# Patient Record
Sex: Male | Born: 1970 | Race: White | Hispanic: No | Smoking: Former smoker
Health system: Southern US, Community
[De-identification: ages and names within clinical notes are randomized; demographics above are authoritative.]

## PROBLEM LIST (undated history)

## (undated) ENCOUNTER — Emergency Department (HOSPITAL_COMMUNITY): Discharge status: Absent without leave | Payer: 59

## (undated) DIAGNOSIS — Z8669 Personal history of other diseases of the nervous system and sense organs: Secondary | ICD-10-CM

## (undated) DIAGNOSIS — E785 Hyperlipidemia, unspecified: Secondary | ICD-10-CM

## (undated) DIAGNOSIS — R51 Headache: Secondary | ICD-10-CM

## (undated) DIAGNOSIS — R519 Headache, unspecified: Secondary | ICD-10-CM

## (undated) DIAGNOSIS — F1011 Alcohol abuse, in remission: Secondary | ICD-10-CM

## (undated) DIAGNOSIS — K219 Gastro-esophageal reflux disease without esophagitis: Secondary | ICD-10-CM

## (undated) DIAGNOSIS — E669 Obesity, unspecified: Secondary | ICD-10-CM

## (undated) DIAGNOSIS — E119 Type 2 diabetes mellitus without complications: Secondary | ICD-10-CM

## (undated) DIAGNOSIS — I639 Cerebral infarction, unspecified: Secondary | ICD-10-CM

## (undated) DIAGNOSIS — T7840XA Allergy, unspecified, initial encounter: Secondary | ICD-10-CM

## (undated) DIAGNOSIS — I10 Essential (primary) hypertension: Secondary | ICD-10-CM

## (undated) HISTORY — DX: Personal history of other diseases of the nervous system and sense organs: Z86.69

## (undated) HISTORY — DX: Headache: R51

## (undated) HISTORY — DX: Headache, unspecified: R51.9

## (undated) HISTORY — DX: Allergy, unspecified, initial encounter: T78.40XA

## (undated) HISTORY — DX: Obesity, unspecified: E66.9

## (undated) HISTORY — DX: Gastro-esophageal reflux disease without esophagitis: K21.9

## (undated) HISTORY — DX: Hyperlipidemia, unspecified: E78.5

## (undated) HISTORY — DX: Essential (primary) hypertension: I10

## (undated) HISTORY — DX: Alcohol abuse, in remission: F10.11

## (undated) HISTORY — DX: Type 2 diabetes mellitus without complications: E11.9

## (undated) HISTORY — DX: Cerebral infarction, unspecified: I63.9

---
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
## 2003-08-17 ENCOUNTER — Ambulatory Visit (HOSPITAL_COMMUNITY): Admission: RE | Admit: 2003-08-17 | Discharge: 2003-08-17 | Payer: Self-pay | Admitting: Pulmonary Disease

## 2003-09-01 ENCOUNTER — Ambulatory Visit (HOSPITAL_COMMUNITY): Admission: RE | Admit: 2003-09-01 | Discharge: 2003-09-01 | Payer: Self-pay | Admitting: Internal Medicine

## 2012-01-04 ENCOUNTER — Encounter: Payer: Self-pay | Admitting: Family Medicine

## 2012-01-04 ENCOUNTER — Ambulatory Visit (INDEPENDENT_AMBULATORY_CARE_PROVIDER_SITE_OTHER): Payer: 59 | Admitting: Family Medicine

## 2012-01-04 VITALS — BP 158/110 | HR 88 | Temp 98.5°F | Ht 75.0 in | Wt 243.8 lb

## 2012-01-04 DIAGNOSIS — R51 Headache: Secondary | ICD-10-CM

## 2012-01-04 DIAGNOSIS — K219 Gastro-esophageal reflux disease without esophagitis: Secondary | ICD-10-CM | POA: Insufficient documentation

## 2012-01-04 DIAGNOSIS — R519 Headache, unspecified: Secondary | ICD-10-CM | POA: Insufficient documentation

## 2012-01-04 DIAGNOSIS — I10 Essential (primary) hypertension: Secondary | ICD-10-CM

## 2012-01-04 DIAGNOSIS — E669 Obesity, unspecified: Secondary | ICD-10-CM

## 2012-01-04 DIAGNOSIS — E663 Overweight: Secondary | ICD-10-CM | POA: Insufficient documentation

## 2012-01-04 DIAGNOSIS — Z23 Encounter for immunization: Secondary | ICD-10-CM

## 2012-01-04 LAB — CBC WITH DIFFERENTIAL/PLATELET
Basophils Absolute: 0 10*3/uL (ref 0.0–0.1)
Basophils Relative: 0.6 % (ref 0.0–3.0)
Eosinophils Absolute: 0.2 10*3/uL (ref 0.0–0.7)
Eosinophils Relative: 5.1 % — ABNORMAL HIGH (ref 0.0–5.0)
HCT: 45.9 % (ref 39.0–52.0)
Hemoglobin: 15.7 g/dL (ref 13.0–17.0)
Lymphocytes Relative: 31.6 % (ref 12.0–46.0)
Lymphs Abs: 1.4 10*3/uL (ref 0.7–4.0)
MCHC: 34.2 g/dL (ref 30.0–36.0)
MCV: 92.6 fl (ref 78.0–100.0)
Monocytes Absolute: 0.3 10*3/uL (ref 0.1–1.0)
Monocytes Relative: 7 % (ref 3.0–12.0)
Neutro Abs: 2.5 10*3/uL (ref 1.4–7.7)
Neutrophils Relative %: 55.7 % (ref 43.0–77.0)
Platelets: 200 10*3/uL (ref 150.0–400.0)
RBC: 4.96 Mil/uL (ref 4.22–5.81)
RDW: 13.5 % (ref 11.5–14.6)
WBC: 4.5 10*3/uL (ref 4.5–10.5)

## 2012-01-04 LAB — COMPREHENSIVE METABOLIC PANEL
ALT: 32 U/L (ref 0–53)
AST: 24 U/L (ref 0–37)
Albumin: 4.6 g/dL (ref 3.5–5.2)
Alkaline Phosphatase: 58 U/L (ref 39–117)
BUN: 13 mg/dL (ref 6–23)
CO2: 29 mEq/L (ref 19–32)
Calcium: 9.5 mg/dL (ref 8.4–10.5)
Chloride: 106 mEq/L (ref 96–112)
Creatinine, Ser: 1.1 mg/dL (ref 0.4–1.5)
GFR: 81.01 mL/min (ref >60.00)
Glucose, Bld: 100 mg/dL — ABNORMAL HIGH (ref 70–99)
Potassium: 4.6 mEq/L (ref 3.5–5.1)
Sodium: 141 mEq/L (ref 135–145)
Total Bilirubin: 0.3 mg/dL (ref 0.3–1.2)
Total Protein: 7.4 g/dL (ref 6.0–8.3)

## 2012-01-04 LAB — LIPID PANEL
Cholesterol: 185 mg/dL (ref 0–200)
HDL: 45.4 mg/dL (ref >39.00)
LDL Cholesterol: 118 mg/dL — ABNORMAL HIGH (ref 0–99)
Total CHOL/HDL Ratio: 4
Triglycerides: 107 mg/dL (ref 0.0–149.0)
VLDL: 21.4 mg/dL (ref 0.0–40.0)

## 2012-01-04 LAB — TSH: TSH: 1.24 u[IU]/mL (ref 0.35–5.50)

## 2012-01-04 LAB — MICROALBUMIN / CREATININE URINE RATIO
Creatinine,U: 153.2 mg/dL
Microalb Creat Ratio: 1 mg/g (ref 0.0–30.0)
Microalb, Ur: 1.6 mg/dL (ref 0.0–1.9)

## 2012-01-04 MED ORDER — HYDROCHLOROTHIAZIDE 25 MG PO TABS: 25.0000 mg | ORAL_TABLET | Freq: Every day | ORAL | Status: DC

## 2012-01-04 MED ORDER — CYCLOBENZAPRINE HCL 10 MG PO TABS: 10.0000 mg | ORAL_TABLET | Freq: Two times a day (BID) | ORAL | Status: AC | PRN

## 2012-01-04 NOTE — Assessment & Plan Note (Signed)
Significant weight loss recently.  Encouraged, rec continue healthy diet changes.

## 2012-01-04 NOTE — Progress Notes (Signed)
Addended by: Josph Macho A on: 01/04/2012 11:10 AM   Modules accepted: Orders

## 2012-01-04 NOTE — Assessment & Plan Note (Addendum)
H/o this years past. Discussed low salt, increased potassium in diet, benefits of weight loss. Blood work today. Start HCTZ - see pt instructions. EKG today for baseline - NSR, rate 66, normal axis, intervals, no ST/T changes rtc 4-6 wks for f/u.

## 2012-01-04 NOTE — Assessment & Plan Note (Signed)
Anticipate TTH given story. Trial muscle relaxant. Avoid NSAIDs, BC powders for now. Re assess next month.

## 2012-01-04 NOTE — Assessment & Plan Note (Signed)
Controlled on zegerid daily.

## 2012-01-04 NOTE — Progress Notes (Signed)
Subjective:    Patient ID: Jeffery Banks, male    DOB: [birth year], 41 y.o.   MRN: [id number]  HPI CC: new pt, HTN  HTN - h/o HTN 12 yrs ago on atenolol, got under control and stopped med.  + HA - see below.  No CP/tightness, SOB, leg swelling.  HA - daily, going on for several years now.  Staying same, not progressively worsening.  start in neck, travel up back of head.  Described as throbbing pain, steady constant as well.  No n/v, photo/phonophobia.  No vision changes.  + h/o migraines as teenager.  Body mass index is 30.47 kg/(m^2). Wt Readings from Last 3 Encounters:  01/04/12 243 lb 12 oz (110.564 kg)  Recent weight loss - previously 275.  Has been changing diet (cut out sodas and late eating), increasing activity.  Preventative: No recent CPE (10 yrs ago).  Last blood work was also 10 yrs ago. No flu shot. Tetanus shot - unsure.  Tdap today.  Caffeine: coffee in am Lives with wife and 3 children Occupation: Transport planner for [employer] Edu: Associate's degree Activity: walking at night Diet: water daily, some fruits/vegetables, red meat 2x/wk, fish 1x/mo  Medications and allergies reviewed and updated in chart.  Past histories reviewed and updated if relevant as below. There is no problem list on file for this patient.  Past Medical History  Diagnosis Date  . Hx of migraines     as child  . Generalized headaches     daily  . HTN (hypertension)   . GERD (gastroesophageal reflux disease)   . Alcohol abuse, in remission college   Past Surgical History  Procedure Date  . Appendectomy 1982   History  Substance Use Topics  . Smoking status: Former Smoker    Quit date: 10/22/2010  . Smokeless tobacco: Never Used  . Alcohol Use: No   Family History  Problem Relation Age of Onset  . Hypertension Mother   . Cancer Maternal Aunt     beast  . Hypertension Maternal Grandfather   . Coronary artery disease Maternal Grandfather   . Hypertension Paternal Grandfather     . Coronary artery disease Paternal Grandfather   . Stroke Neg Hx   . Diabetes Neg Hx    No Known Allergies No current outpatient prescriptions on file prior to visit.     Review of Systems  Constitutional: Negative for fever, chills, activity change, appetite change, fatigue and unexpected weight change.  HENT: Negative for hearing loss and neck pain.   Eyes: Negative for visual disturbance.  Respiratory: Negative for cough, chest tightness, shortness of breath and wheezing.   Cardiovascular: Negative for chest pain, palpitations and leg swelling.  Gastrointestinal: Negative for nausea, vomiting, abdominal pain, diarrhea, constipation, blood in stool and abdominal distention.  Genitourinary: Negative for hematuria and difficulty urinating.  Musculoskeletal: Negative for myalgias and arthralgias.  Skin: Negative for rash.  Neurological: Positive for headaches. Negative for dizziness, seizures and syncope.  Hematological: Does not bruise/bleed easily.  Psychiatric/Behavioral: Negative for dysphoric mood. The patient is not nervous/anxious.        Objective:   Physical Exam  Nursing note and vitals reviewed. Constitutional: Jeffery Banks is oriented to person, place, and time. Jeffery Banks appears well-developed and well-nourished. No distress.  HENT:  Head: Normocephalic and atraumatic.  Right Ear: External ear normal.  Left Ear: External ear normal.  Nose: Nose normal.  Mouth/Throat: Oropharynx is clear and moist. No oropharyngeal exudate.  Eyes: Conjunctivae and EOM are  normal. Pupils are equal, round, and reactive to light. No scleral icterus.  Neck: Normal range of motion. Neck supple. Carotid bruit is not present. No thyromegaly present.  Cardiovascular: Normal rate, regular rhythm, normal heart sounds and intact distal pulses.   No murmur heard. Pulses:      Radial pulses are 2+ on the right side, and 2+ on the left side.  Pulmonary/Chest: Effort normal and breath sounds normal. No  respiratory distress. Jeffery Banks has no wheezes. Jeffery Banks has no rales.  Abdominal: Soft. Bowel sounds are normal. Jeffery Banks exhibits no distension and no mass. There is no tenderness. There is no rebound and no guarding.       No abd/renal bruits  Musculoskeletal: Normal range of motion. Jeffery Banks exhibits no edema.       FROM neck. No midline spine tenderness. No trap muscle tightness/spasm appreciated today  Lymphadenopathy:    Jeffery Banks has no cervical adenopathy.  Neurological: Jeffery Banks is alert and oriented to person, place, and time.       CN grossly intact, station and gait intact  Skin: Skin is warm and dry. No rash noted.  Psychiatric: Jeffery Banks has a normal mood and affect. His behavior is normal. Judgment and thought content normal.      Assessment & Plan:

## 2012-01-04 NOTE — Patient Instructions (Addendum)
Blood work today. EKG today. Start hydrochlorothiazide at 12.5mg  (1/2 pill) daily for 1 week then may increase to 25mg  (1 pill daily). Return in 3-4 weeks to recheck blood work Engineer, structural)  Return to see me in 4-6 weeks to see how wer'e doing. Tdap today. For headaches - sound like tension type headaches or could be related to blood pressure.  Try flexeril 10mg  as needed.

## 2012-01-04 NOTE — Progress Notes (Signed)
Addended by: Eustaquio Boyden on: 01/04/2012 10:55 AM   Modules accepted: Orders

## 2012-02-07 ENCOUNTER — Encounter: Payer: Self-pay | Admitting: Family Medicine

## 2012-02-07 ENCOUNTER — Ambulatory Visit (INDEPENDENT_AMBULATORY_CARE_PROVIDER_SITE_OTHER): Payer: 59 | Admitting: Family Medicine

## 2012-02-07 VITALS — BP 150/90 | HR 80 | Temp 97.4°F | Wt 245.2 lb

## 2012-02-07 DIAGNOSIS — R519 Headache, unspecified: Secondary | ICD-10-CM

## 2012-02-07 DIAGNOSIS — R51 Headache: Secondary | ICD-10-CM

## 2012-02-07 DIAGNOSIS — I10 Essential (primary) hypertension: Secondary | ICD-10-CM

## 2012-02-07 MED ORDER — LISINOPRIL 10 MG PO TABS: 10.0000 mg | ORAL_TABLET | Freq: Every day | ORAL | Status: DC

## 2012-02-07 NOTE — Assessment & Plan Note (Signed)
rec try flexeril at 5mg  daily (1/2 pill)

## 2012-02-07 NOTE — Patient Instructions (Signed)
Continue hydrochlorothiazide. Start lisinopril at 10mg  daily. Return in 2 weeks for blood work and in 1 1/2 -2 months for follow up. Good to see you today, call us with questions.

## 2012-02-07 NOTE — Progress Notes (Signed)
  Subjective:    Patient ID: Jeffery Banks, male    DOB: Mar 12, 1971, 41 y.o.   MRN: [id number]  HPI CC: f/u HTN  HTN - Seen here last month as new patient with h/o HTN.  Started on HCTZ 25mg  daily.  Tolerating med well but notes bp still elevated at home.  No HA, vision changes, CP/tightness, SOB, leg swelling.  Good amt water during day.  BP Readings from Last 3 Encounters:  02/07/12 150/90  01/04/12 158/110    HA - thought TTH last visit.  Flexeril makes him too sleepy.  Also notices sinus congestion with headaches, wonders if sinus headache. Eventually wears off during day.  If lingers takes aleve which resolves HA.  Has stopped bc and goody powders.  Review of Systems Per HPI    Objective:   Physical Exam  Nursing note and vitals reviewed. Constitutional: He appears well-developed and well-nourished. No distress.  HENT:  Head: Normocephalic and atraumatic.  Mouth/Throat: Oropharynx is clear and moist. No oropharyngeal exudate.  Eyes: Conjunctivae and EOM are normal. Pupils are equal, round, and reactive to light. No scleral icterus.  Neck: Normal range of motion. Neck supple. Carotid bruit is not present.  Cardiovascular: Normal rate, regular rhythm, normal heart sounds and intact distal pulses.   No murmur heard. Pulmonary/Chest: Breath sounds normal. No respiratory distress. He has no wheezes. He has no rales.  Skin: Skin is warm and dry. No rash noted.       Assessment & Plan:

## 2012-02-07 NOTE — Assessment & Plan Note (Signed)
Chronic. staying elevated although some better control. Will add on lisinopril. Advised to watch for angioedema sxs and dry cough, and watch for dizziness. rtc 2wks for bmp, and 1-2 mo for OV.

## 2012-02-21 ENCOUNTER — Other Ambulatory Visit: Payer: Self-pay | Admitting: *Deleted

## 2012-02-21 ENCOUNTER — Other Ambulatory Visit (INDEPENDENT_AMBULATORY_CARE_PROVIDER_SITE_OTHER): Payer: 59

## 2012-02-21 DIAGNOSIS — I10 Essential (primary) hypertension: Secondary | ICD-10-CM

## 2012-02-21 LAB — BASIC METABOLIC PANEL
BUN: 19 mg/dL (ref 6–23)
CO2: 27 mEq/L (ref 19–32)
Calcium: 9.7 mg/dL (ref 8.4–10.5)
Chloride: 104 mEq/L (ref 96–112)
Creatinine, Ser: 1 mg/dL (ref 0.4–1.5)
GFR: 87.46 mL/min (ref >60.00)
Glucose, Bld: 105 mg/dL — ABNORMAL HIGH (ref 70–99)
Potassium: 3.8 mEq/L (ref 3.5–5.1)
Sodium: 139 mEq/L (ref 135–145)

## 2012-02-21 MED ORDER — CYCLOBENZAPRINE HCL 10 MG PO TABS: 10.0000 mg | ORAL_TABLET | Freq: Three times a day (TID) | ORAL | Status: DC | PRN

## 2012-02-21 NOTE — Telephone Encounter (Signed)
Sent in.plz notify pt.  

## 2012-02-21 NOTE — Telephone Encounter (Signed)
Ok to refill so he can have it on hand for headaches?

## 2012-04-15 ENCOUNTER — Ambulatory Visit (INDEPENDENT_AMBULATORY_CARE_PROVIDER_SITE_OTHER): Payer: 59 | Admitting: Family Medicine

## 2012-04-15 ENCOUNTER — Encounter: Payer: Self-pay | Admitting: Family Medicine

## 2012-04-15 VITALS — BP 136/82 | HR 96 | Temp 98.0°F | Wt 246.0 lb

## 2012-04-15 DIAGNOSIS — I10 Essential (primary) hypertension: Secondary | ICD-10-CM

## 2012-04-15 DIAGNOSIS — R51 Headache: Secondary | ICD-10-CM

## 2012-04-15 NOTE — Assessment & Plan Note (Signed)
Improved with better blood pressure control.

## 2012-04-15 NOTE — Assessment & Plan Note (Signed)
Chronic, stable. Great control as evidenced by #s at home.  Continue meds, rtc 6 mo for physical.

## 2012-04-15 NOTE — Patient Instructions (Signed)
Great to see you today, call us with questions. Let me know if leg rash not improving. Continue meds as up to now.

## 2012-04-15 NOTE — Progress Notes (Signed)
  Subjective:    Patient ID: Jeffery Banks, male    DOB: 1970/11/11, 41 y.o.   MRN: [id number]  HPI CC: f/u HTN  2 mo f/u HTN - tolerating meds well.  No HA, vision changes, CP/tightness, SOB, leg swelling.  At home running 120/80s.    HA - doing well.  hsan't had any recently.  Thinks better bp control has helped.  Was recently on vacation.  Used sunscreen but does burn easily.  Burned right lower leg, rash has developed.  Treating with aloe cream and compresses.  Review of Systems Per HPI    Objective:   Physical Exam  Vitals reviewed. Constitutional: He appears well-developed and well-nourished. No distress.  HENT:  Head: Normocephalic and atraumatic.  Mouth/Throat: Oropharynx is clear and moist. No oropharyngeal exudate.  Eyes: Conjunctivae and EOM are normal. Pupils are equal, round, and reactive to light. No scleral icterus.  Cardiovascular: Normal rate, regular rhythm, normal heart sounds and intact distal pulses.   No murmur heard. Pulmonary/Chest: Effort normal and breath sounds normal. No respiratory distress. He has no wheezes. He has no rales.  Abdominal: Soft. There is no tenderness.       No abd/renal bruits  Musculoskeletal: He exhibits no edema.  Skin: Skin is warm and dry. No rash noted. There is erythema.       Right lower leg anteriorly with blistered and erythematous rash, tender  Psychiatric: He has a normal mood and affect.       Assessment & Plan:

## 2012-10-16 ENCOUNTER — Ambulatory Visit (INDEPENDENT_AMBULATORY_CARE_PROVIDER_SITE_OTHER): Payer: 59 | Admitting: Family Medicine

## 2012-10-16 ENCOUNTER — Ambulatory Visit: Payer: 59

## 2012-10-16 VITALS — BP 139/92 | HR 86 | Temp 98.3°F | Resp 20 | Ht 75.0 in | Wt 249.0 lb

## 2012-10-16 DIAGNOSIS — R05 Cough: Secondary | ICD-10-CM

## 2012-10-16 DIAGNOSIS — R0609 Other forms of dyspnea: Secondary | ICD-10-CM

## 2012-10-16 DIAGNOSIS — R0600 Dyspnea, unspecified: Secondary | ICD-10-CM

## 2012-10-16 DIAGNOSIS — J40 Bronchitis, not specified as acute or chronic: Secondary | ICD-10-CM

## 2012-10-16 DIAGNOSIS — R059 Cough, unspecified: Secondary | ICD-10-CM

## 2012-10-16 DIAGNOSIS — R0989 Other specified symptoms and signs involving the circulatory and respiratory systems: Secondary | ICD-10-CM

## 2012-10-16 DIAGNOSIS — R062 Wheezing: Secondary | ICD-10-CM

## 2012-10-16 LAB — POCT CBC
Granulocyte percent: 65.4 %G (ref 37–80)
HCT, POC: 48.6 % (ref 43.5–53.7)
Hemoglobin: 15.5 g/dL (ref 14.1–18.1)
Lymph, poc: 1.2 (ref 0.6–3.4)
MCH, POC: 31.1 pg (ref 27–31.2)
MCHC: 31.9 g/dL (ref 31.8–35.4)
MCV: 97.6 fL — AB (ref 80–97)
MID (cbc): 0.4 (ref 0–0.9)
MPV: 9 fL (ref 0–99.8)
POC Granulocyte: 3 (ref 2–6.9)
POC LYMPH PERCENT: 26 %L (ref 10–50)
POC MID %: 8.6 %M (ref 0–12)
Platelet Count, POC: 203 10*3/uL (ref 142–424)
RBC: 4.98 M/uL (ref 4.69–6.13)
RDW, POC: 13.1 %
WBC: 4.6 10*3/uL (ref 4.6–10.2)

## 2012-10-16 LAB — POCT INFLUENZA A/B
Influenza A, POC: NEGATIVE
Influenza B, POC: NEGATIVE

## 2012-10-16 MED ORDER — HYDROCODONE-HOMATROPINE 5-1.5 MG/5ML PO SYRP: 5.0000 mL | ORAL_SOLUTION | Freq: Three times a day (TID) | ORAL | Status: DC | PRN

## 2012-10-16 MED ORDER — ALBUTEROL SULFATE (2.5 MG/3ML) 0.083% IN NEBU
2.5000 mg | INHALATION_SOLUTION | Freq: Once | RESPIRATORY_TRACT | Status: AC
  Administered 2012-10-16: 2.5 mg via RESPIRATORY_TRACT

## 2012-10-16 MED ORDER — PREDNISONE 20 MG PO TABS: ORAL_TABLET | ORAL | Status: DC

## 2012-10-16 MED ORDER — ALBUTEROL SULFATE HFA 108 (90 BASE) MCG/ACT IN AERS: 2.0000 | INHALATION_SPRAY | Freq: Four times a day (QID) | RESPIRATORY_TRACT | Status: DC | PRN

## 2012-10-16 NOTE — Patient Instructions (Addendum)
Bronchitis Bronchitis is the body's way of reacting to injury and/or infection (inflammation) of the bronchi. Bronchi are the air tubes that extend from the windpipe into the lungs. If the inflammation becomes severe, it may cause shortness of breath. CAUSES  Inflammation may be caused by:  A virus.  Germs (bacteria).  Dust.  Allergens.  Pollutants and many other irritants. The cells lining the bronchial tree are covered with tiny hairs (cilia). These constantly beat upward, away from the lungs, toward the mouth. This keeps the lungs free of pollutants. When these cells become too irritated and are unable to do their job, mucus begins to develop. This causes the characteristic cough of bronchitis. The cough clears the lungs when the cilia are unable to do their job. Without either of these protective mechanisms, the mucus would settle in the lungs. Then you would develop pneumonia. Smoking is a common cause of bronchitis and can contribute to pneumonia. Stopping this habit is the single most important thing you can do to help yourself. TREATMENT   Your caregiver may prescribe an antibiotic if the cough is caused by bacteria. Also, medicines that open up your airways make it easier to breathe. Your caregiver may also recommend or prescribe an expectorant. It will loosen the mucus to be coughed up. Only take over-the-counter or prescription medicines for pain, discomfort, or fever as directed by your caregiver.  Removing whatever causes the problem (smoking, for example) is critical to preventing the problem from getting worse.  Cough suppressants may be prescribed for relief of cough symptoms.  Inhaled medicines may be prescribed to help with symptoms now and to help prevent problems from returning.  For those with recurrent (chronic) bronchitis, there may be a need for steroid medicines. SEEK IMMEDIATE MEDICAL CARE IF:   During treatment, you develop more pus-like mucus (purulent  sputum).  You have a fever.  Your baby is older than 3 months with a rectal temperature of 102 F (38.9 C) or higher.  Your baby is 22 months old or younger with a rectal temperature of 100.4 F (38 C) or higher.  You become progressively more ill.  You have increased difficulty breathing, wheezing, or shortness of breath. It is necessary to seek immediate medical care if you are elderly or sick from any other disease. MAKE SURE YOU:   Understand these instructions.  Will watch your condition.  Will get help right away if you are not doing well or get worse. Document Released: 10/08/2005 Document Revised: 12/31/2011 Document Reviewed: 08/17/2008 [facility] Patient Information 2013 [place], [place]. Using Your Inhaler Proper inhaler technique is very important. Good technique assures that the medicine reaches the lungs. Poor technique results in depositing the medicine on the tongue and back of the throat rather than in the airways. STEPS TO FOLLOW IF USING INHALER WITHOUT EXTENSION TUBE: 1. Remove cap from inhaler. 2. Shake inhaler for 5 seconds before each inhalation (breathing in). 3. Position the inhaler so that the top of the canister faces up. 4. Put your index finger on the top of the medication canister. Your thumb supports the bottom of the inhaler. 5. Open your mouth. 6. Hold the inhaler 1 to 2 inches away from your open mouth. This allows the medicine to slow down before the medicine enters the mouth. 7. Exhale (breathe out) normally and as completely as possible. 8. Press the canister down with the index finger to release the medication. 9. At the same time as the canister is pressed, inhale deeply  and slowly until the lungs are completely filled. This should take 4 to 6 seconds. Keep your tongue down and out of the way. 10. Hold the medication in your lungs for up to 10 seconds (10 seconds is best). This helps the medicine get into the small airways of your lungs to work  better. 11. Breathe out slowly, through pursed lips. Whistling is an example of pursed lips. 12. Wait at least 1 minute between puffs. Continue with the above steps until you have taken the number of puffs your caregiver has ordered. 13. Replace cap on inhaler. STEPS TO FOLLOW USING AN INHALER WITH AN EXTENSION (SPACER): 1.  Remove cap from inhaler. 2. Shake inhaler for 5 seconds before each inhalation (breathing in). 3. Your caregiver has asked you to use a spacer with your inhaler. A spacer is a plastic tube with a mouthpiece on one end and an opening that connects to the inhaler on the other end. A spacer helps you take the medicine better. 4. Place the open end of the spacer onto the mouthpiece of the inhaler. 5. Position the inhaler so that the top of the canister faces up and the spacer mouthpiece faces you. 6. Put your index finger on the top of the medication canister. Your thumb supports the bottom of the inhaler and the spacer. 7. Exhale (breathe out) normally and as completely as possible. 8. Immediately after exhaling, place the spacer between your teeth and into your mouth. Close your mouth tightly around the spacer. 9. Press the canister down with the index finger to release the medication. 10. At the same time as the canister is pressed, inhale deeply and slowly until the lungs are completely filled. This should take 4 to 6 seconds. Keep your tongue down and out of the way. 11. Hold the medication in your lungs for up to 10 seconds (10 seconds is best). This helps the medicine get into the small airways of your lungs to work better. Exhale. 12. Repeat inhaling deeply through the spacer mouthpiece. Again hold that breath for up to 10 seconds (10 seconds is best). Exhale slowly. If it is difficult to take this second deep breath through the spacer, breathe normally several times through the spacer. Remove the spacer from your mouth. 13. Wait at least 1 minute between puffs. Continue  with the above steps until you have taken the number of puffs your caregiver has ordered. 14. Remove spacer from the inhaler and place cap on inhaler. If you are using different kinds of inhalers, use your quick relief medicine to open the airways 10 - 15 minutes before using a steroid. If you are unsure which inhalers to use and the order of using them, ask your caregiver, nurse, or respiratory therapist. If you are using a steroid inhaler, rinse your mouth with water after your last puff and then spit out the water. Do not swallow the water. Avoid the following:  Inhaling before or after starting the spray of medicine. It takes practice to coordinate your breathing with triggering the spray.  Inhaling through the nose (rather than the mouth) when triggering the spray. HOW TO DETERMINE IF YOUR INHALER IS FULL OR NEARLY EMPTY:  Determine when an inhaler is empty. You cannot know when an inhaler is empty by shaking it. A few inhalers are now being made with dose counters. Ask your caregiver for a prescription that has a dose counter if you feel you need that extra help.  If your inhaler does not  have a counter, check the number of doses in the inhaler before you use it. The canister or box will list the number of doses in the canister. Divide the total number of doses in the canister by the number you will use each day to find how many days the canister will last. (For example, if your canister has 200 doses and you take 2 puffs, 4 times each day, which is 8 puffs a day. Dividing 200 by 8 equals 25. The canister should last 25 days.) Using a calendar, count forward that many days to see when your inhaler will run out. Write the refill date on a calendar or your canister.  Remember, if you need to take extra doses, the inhaler will empty sooner than you figured. Be sure you have a refill before your canister runs out. Refill your inhaler 7 to 10 days before it runs out. HOME CARE INSTRUCTIONS   Do  not use the inhaler more than your caregiver tells you. If you are still wheezing and are feeling tightness in your chest, call your caregiver.  Keep an adequate supply of medication. This includes making sure the medicine is not expired, and you have a spare inhaler.  Follow your caregiver or inhaler insert directions for cleaning the inhaler and spacer. SEEK MEDICAL CARE IF:   Symptoms are only partially relieved with your inhaler.  You are having trouble using your inhaler.  You experience some increase in phlegm.  You develop a fever of 100.5 F (38.1 C). SEEK IMMEDIATE MEDICAL CARE IF:   You feel little or no relief with your inhalers. You are still wheezing and are feeling shortness of breath and/or tightness in your chest.  You have side effects such as dizziness, headaches or fast heart rate.  You have chills, fever, night sweats or an oral temperature above 102 F (38.9 C).  Phlegm production increases a lot, or there is blood in the phlegm. MAKE SURE YOU:   Understand these instructions.  Will watch your condition.  Will get help right away if you are not doing well or get worse. Document Released: 10/05/2000 Document Revised: 12/31/2011 Document Reviewed: 07/26/2009 [facility] Patient Information 2013 [place], [place].

## 2012-10-16 NOTE — Progress Notes (Signed)
41 yo man with acute respiratory distress and fever since he fell ill on Monday.  He started with cough, aches, fatigue, fever, chills and rigors..  The cough is worse at night.  Today he is bringing up phlegm.  Did not get a flu shot. Works for [employer] PMHx:  htn and reflux Sig Negs:  No h/o smoking, asthma or pulmonary disease  Objective:  NAD HEENT:  Unremarkable Neck:  Supple without adenop Heart:  Reg without murmur Chest:  Diffuse wheezes on expiration and bibasilar rales Ext:  No edema, calves nontender  [facility] reading (PRIMARY) by  Dr. Milus Glazier CXR  No infiltrate or cardiomegaly Results for orders placed in visit on 10/16/12  POCT CBC      Component Value Range   WBC 4.6  4.6 - 10.2 K/uL   Lymph, poc 1.2  0.6 - 3.4   POC LYMPH PERCENT 26.0  10 - 50 %L   MID (cbc) 0.4  0 - 0.9   POC MID % 8.6  0 - 12 %M   POC Granulocyte 3.0  2 - 6.9   Granulocyte percent 65.4  37 - 80 %G   RBC 4.98  4.69 - 6.13 M/uL   Hemoglobin 15.5  14.1 - 18.1 g/dL   HCT, POC 47.8  29.5 - 53.7 %   MCV 97.6 (*) 80 - 97 fL   MCH, POC 31.1  27 - 31.2 pg   MCHC 31.9  31.8 - 35.4 g/dL   RDW, POC 62.1     Platelet Count, POC 203  142 - 424 K/uL   MPV 9.0  0 - 99.8 fL  POCT INFLUENZA A/B      Component Value Range   Influenza A, POC Negative     Influenza B, POC Negative        Assessment:  .viral bronchitis  Plan:

## 2012-10-23 ENCOUNTER — Encounter: Payer: 59 | Admitting: Family Medicine

## 2012-10-23 ENCOUNTER — Other Ambulatory Visit: Payer: 59

## 2012-12-22 ENCOUNTER — Other Ambulatory Visit: Payer: Self-pay | Admitting: Family Medicine

## 2013-03-18 ENCOUNTER — Other Ambulatory Visit: Payer: Self-pay | Admitting: Family Medicine

## 2013-07-04 ENCOUNTER — Other Ambulatory Visit: Payer: Self-pay | Admitting: Family Medicine

## 2013-10-23 ENCOUNTER — Other Ambulatory Visit: Payer: Self-pay | Admitting: Family Medicine

## 2013-10-30 ENCOUNTER — Encounter: Payer: Self-pay | Admitting: Family Medicine

## 2013-10-30 ENCOUNTER — Ambulatory Visit (INDEPENDENT_AMBULATORY_CARE_PROVIDER_SITE_OTHER): Payer: 59 | Admitting: Family Medicine

## 2013-10-30 VITALS — BP 138/100 | HR 64 | Temp 98.2°F | Wt 254.5 lb

## 2013-10-30 DIAGNOSIS — I10 Essential (primary) hypertension: Secondary | ICD-10-CM

## 2013-10-30 DIAGNOSIS — Z23 Encounter for immunization: Secondary | ICD-10-CM

## 2013-10-30 LAB — MICROALBUMIN / CREATININE URINE RATIO
Creatinine,U: 38.1 mg/dL
Microalb Creat Ratio: 0.8 mg/g (ref 0.0–30.0)
Microalb, Ur: 0.3 mg/dL (ref 0.0–1.9)

## 2013-10-30 LAB — BASIC METABOLIC PANEL
BUN: 15 mg/dL (ref 6–23)
CO2: 28 mEq/L (ref 19–32)
Calcium: 9.6 mg/dL (ref 8.4–10.5)
Chloride: 105 mEq/L (ref 96–112)
Creatinine, Ser: 1.2 mg/dL (ref 0.4–1.5)
GFR: 74.65 mL/min (ref >60.00)
Glucose, Bld: 88 mg/dL (ref 70–99)
Potassium: 4.2 mEq/L (ref 3.5–5.1)
Sodium: 139 mEq/L (ref 135–145)

## 2013-10-30 MED ORDER — LISINOPRIL 10 MG PO TABS: ORAL_TABLET | ORAL | Status: DC

## 2013-10-30 MED ORDER — HYDROCHLOROTHIAZIDE 25 MG PO TABS: ORAL_TABLET | ORAL | Status: DC

## 2013-10-30 NOTE — Progress Notes (Signed)
Pre-visit discussion using our clinic review tool. No additional management support is needed unless otherwise documented below in the visit note.  

## 2013-10-30 NOTE — Progress Notes (Signed)
   Subjective:    Patient ID: Jeffery GuarneriJason Vincent Banks, male    DOB: April 04, 1971, 43 y.o.   MRN: [id number]  HPI CC: med refill  HTN - Compliant with current antihypertensive regimen of lisinopril 10mg  and hctz 25mg  daily (out of med for last 2 weeks).  Does check blood pressures at home: 115-122/80-90.  No low blood pressure readings or symptoms of dizziness/syncope.  Denies HA, vision changes, CP/tightness, SOB, leg swelling.   BP Readings from Last 3 Encounters:  10/30/13 138/100  10/16/12 139/92  04/15/12 136/82   Body mass index is 31.81 kg/(m^2).  Wt Readings from Last 3 Encounters:  10/30/13 254 lb 8 oz (115.44 kg)  10/16/12 249 lb (112.946 kg)  04/15/12 246 lb (111.585 kg)    Past Medical History  Diagnosis Date  . Hx of migraines     as child  . Generalized headaches     daily  . HTN (hypertension)   . GERD (gastroesophageal reflux disease)   . Alcohol abuse, in remission college  . Obesity     borderline     Review of Systems Per HPi    Objective:   Physical Exam  Nursing note and vitals reviewed. Constitutional: He appears well-developed and well-nourished. No distress.  HENT:  Mouth/Throat: Oropharynx is clear and moist. No oropharyngeal exudate.  Eyes: Conjunctivae and EOM are normal. Pupils are equal, round, and reactive to light. No scleral icterus.  Cardiovascular: Normal rate, regular rhythm, normal heart sounds and intact distal pulses.   No murmur heard. Pulmonary/Chest: Effort normal and breath sounds normal. No respiratory distress. He has no wheezes. He has no rales.  Musculoskeletal: He exhibits no edema.  Lymphadenopathy:    He has no cervical adenopathy.  Skin: Skin is warm and dry. No rash noted.       Assessment & Plan:

## 2013-10-30 NOTE — Patient Instructions (Signed)
Flu shot today. Blood work today. Return as needed for follow up or in 1 year for physical. Good to see you today, call us with questions.

## 2013-10-30 NOTE — Assessment & Plan Note (Signed)
Chronic, stable. Continue meds  Refilled x 1 year today. Check Cr and K and microalb today.

## 2013-11-02 ENCOUNTER — Encounter: Payer: Self-pay | Admitting: *Deleted

## 2013-11-02 NOTE — Addendum Note (Signed)
Addended by: Annamarie MajorFUQUAY, Venisha Boehning S on: 11/02/2013 10:38 AM   Modules accepted: Orders

## 2014-02-05 ENCOUNTER — Other Ambulatory Visit: Payer: Self-pay | Admitting: Family Medicine

## 2014-02-05 NOTE — Telephone Encounter (Signed)
Ok to refill 

## 2014-02-06 NOTE — Telephone Encounter (Signed)
Sent!

## 2014-07-26 ENCOUNTER — Telehealth: Payer: Self-pay | Admitting: *Deleted

## 2014-07-26 NOTE — Telephone Encounter (Signed)
Request from pharmacy for chantix. Dr. Reece AgarG has never prescribed for patient. LMOM for patient to return my call.

## 2014-09-21 ENCOUNTER — Other Ambulatory Visit: Payer: Self-pay | Admitting: Family Medicine

## 2014-09-21 NOTE — Telephone Encounter (Signed)
Ok to refill 

## 2014-11-09 ENCOUNTER — Other Ambulatory Visit: Payer: Self-pay | Admitting: Family Medicine

## 2014-12-13 ENCOUNTER — Other Ambulatory Visit: Payer: Self-pay | Admitting: Family Medicine

## 2014-12-16 ENCOUNTER — Other Ambulatory Visit: Payer: Self-pay | Admitting: Family Medicine

## 2014-12-28 ENCOUNTER — Ambulatory Visit (INDEPENDENT_AMBULATORY_CARE_PROVIDER_SITE_OTHER): Payer: 59 | Admitting: Family Medicine

## 2014-12-28 ENCOUNTER — Encounter: Payer: Self-pay | Admitting: Family Medicine

## 2014-12-28 VITALS — BP 144/102 | HR 82 | Temp 98.2°F | Ht 75.0 in | Wt 248.4 lb

## 2014-12-28 DIAGNOSIS — Z23 Encounter for immunization: Secondary | ICD-10-CM

## 2014-12-28 DIAGNOSIS — R519 Headache, unspecified: Secondary | ICD-10-CM

## 2014-12-28 DIAGNOSIS — E669 Obesity, unspecified: Secondary | ICD-10-CM

## 2014-12-28 DIAGNOSIS — I10 Essential (primary) hypertension: Secondary | ICD-10-CM

## 2014-12-28 DIAGNOSIS — K219 Gastro-esophageal reflux disease without esophagitis: Secondary | ICD-10-CM

## 2014-12-28 DIAGNOSIS — R51 Headache: Secondary | ICD-10-CM

## 2014-12-28 LAB — COMPREHENSIVE METABOLIC PANEL
ALT: 30 U/L (ref 0–53)
AST: 25 U/L (ref 0–37)
Albumin: 4.4 g/dL (ref 3.5–5.2)
Alkaline Phosphatase: 54 U/L (ref 39–117)
BUN: 20 mg/dL (ref 6–23)
CO2: 30 mEq/L (ref 19–32)
Calcium: 9.4 mg/dL (ref 8.4–10.5)
Chloride: 108 mEq/L (ref 96–112)
Creatinine, Ser: 1.09 mg/dL (ref 0.40–1.50)
GFR: 78.96 mL/min (ref >60.00)
Glucose, Bld: 101 mg/dL — ABNORMAL HIGH (ref 70–99)
Potassium: 4.3 mEq/L (ref 3.5–5.1)
Sodium: 141 mEq/L (ref 135–145)
Total Bilirubin: 0.5 mg/dL (ref 0.2–1.2)
Total Protein: 6.8 g/dL (ref 6.0–8.3)

## 2014-12-28 LAB — LIPID PANEL
Cholesterol: 179 mg/dL (ref 0–200)
HDL: 48.9 mg/dL (ref >39.00)
NonHDL: 130.1
Total CHOL/HDL Ratio: 4
Triglycerides: 216 mg/dL — ABNORMAL HIGH (ref 0.0–149.0)
VLDL: 43.2 mg/dL — ABNORMAL HIGH (ref 0.0–40.0)

## 2014-12-28 LAB — LDL CHOLESTEROL, DIRECT: Direct LDL: 102 mg/dL

## 2014-12-28 MED ORDER — LISINOPRIL 10 MG PO TABS: ORAL_TABLET | ORAL | Status: DC

## 2014-12-28 MED ORDER — HYDROCHLOROTHIAZIDE 25 MG PO TABS: ORAL_TABLET | ORAL | Status: DC

## 2014-12-28 MED ORDER — OMEPRAZOLE-SODIUM BICARBONATE 20-1100 MG PO CAPS: 1.0000 | ORAL_CAPSULE | Freq: Every day | ORAL | Status: DC

## 2014-12-28 NOTE — Assessment & Plan Note (Signed)
Body mass index is 31.05 kg/(m^2).  Reviewed healthy lifestyle changes to affect sustainable weight loss. Pt planning on restarting exercise regimen in form of bicycling.

## 2014-12-28 NOTE — Addendum Note (Signed)
Addended by: Tawnya CrookSAMBATH, Germaine Shenker on: 12/28/2014 09:48 AM   Modules accepted: Orders

## 2014-12-28 NOTE — Assessment & Plan Note (Signed)
Continues zegerid prn.

## 2014-12-28 NOTE — Patient Instructions (Addendum)
Flu shot today. You are doing well. meds refilled for a year. Blood work today. Return as needed or in 1 year for a physical.

## 2014-12-28 NOTE — Progress Notes (Signed)
BP 144/102 mmHg  Pulse 82  Temp(Src) 98.2 F (36.8 C) (Oral)  Ht 6\' 3"  (1.905 m)  Wt 248 lb 6.4 oz (112.674 kg)  BMI 31.05 kg/m2  SpO2 97%   CC: med refill visit  Subjective:    Patient ID: Jeffery Banks, male    DOB: September 02, 1971, 44 y.o.   MRN: [id number]  HPI: Jeffery Banks is a 44 y.o. male presenting on 12/28/2014 for Medication Refill   HTN - Compliant with current antihypertensive regimen of hctz 25mg  and lisinopril 10mg .  Does check blood pressures at home: 120-130/80s. Out of meds for last week, tough morning at work. No low blood pressure readings or symptoms of dizziness/syncope.  Denies HA, vision changes, CP/tightness, SOB, leg swelling.   GERD - stable on zegerid prn.   Preventative: Flu shot today Tdap 2013 Sunscreen use discussed Seatbelt use discussed  Caffeine: coffee in am Lives with wife and 3 children Occupation: Transport plannersales manager for [employer] Edu: Associate's degree Activity: walking at night Diet: water daily, some fruits/vegetables, red meat 2x/wk, fish 1x/mo  Relevant past medical, surgical, family and social history reviewed and updated as indicated. Interim medical history since our last visit reviewed. Allergies and medications reviewed and updated. Current Outpatient Prescriptions on File Prior to Visit  Medication Sig  . cyclobenzaprine (FLEXERIL) 10 MG tablet TAKE 1 TABLET (10 MG TOTAL) BY MOUTH 3 (THREE) TIMES DAILY AS NEEDED.   No current facility-administered medications on file prior to visit.    Review of Systems Per HPI unless specifically indicated above     Objective:    BP 144/102 mmHg  Pulse 82  Temp(Src) 98.2 F (36.8 C) (Oral)  Ht 6\' 3"  (1.905 m)  Wt 248 lb 6.4 oz (112.674 kg)  BMI 31.05 kg/m2  SpO2 97%  Wt Readings from Last 3 Encounters:  12/28/14 248 lb 6.4 oz (112.674 kg)  10/30/13 254 lb 8 oz (115.44 kg)  10/16/12 249 lb (112.946 kg)    Physical Exam  Constitutional: He appears well-developed and  well-nourished. No distress.  Neck: No thyromegaly present.  Cardiovascular: Normal rate, regular rhythm, normal heart sounds and intact distal pulses.   No murmur heard. Pulmonary/Chest: Effort normal and breath sounds normal. No respiratory distress. He has no wheezes. He has no rales.  Musculoskeletal: He exhibits no edema.  Skin: Skin is warm and dry. No rash noted.  Psychiatric: He has a normal mood and affect.  Nursing note and vitals reviewed.     Assessment & Plan:   Problem List Items Addressed This Visit    Obesity    Body mass index is 31.05 kg/(m^2).  Reviewed healthy lifestyle changes to affect sustainable weight loss. Pt planning on restarting exercise regimen in form of bicycling.      Relevant Orders   Lipid panel   HTN (hypertension) - Primary    Chronic, stable. Check labs today. Pt endorses good control at home when on bp meds (ran out 1 wk ago). No changes indicated.      Relevant Medications   hydrochlorothiazide tablet   lisinopril (PRINIVIL,ZESTRIL) tablet   Other Relevant Orders   Lipid panel   Comprehensive metabolic panel   GERD (gastroesophageal reflux disease)    Continues zegerid prn.      Relevant Medications   Omeprazole-Sodium Bicarbonate (ZEGERID) 20-1100 MG CAPS capsule   Generalized headaches    Improved with better bp control.          Follow up plan: Return  in about 1 year (around 12/28/2015), or as needed, for annual exam, prior fasting for blood work.

## 2014-12-28 NOTE — Progress Notes (Signed)
Pre visit review using our clinic review tool, if applicable. No additional management support is needed unless otherwise documented below in the visit note. 

## 2014-12-28 NOTE — Assessment & Plan Note (Signed)
Chronic, stable. Check labs today. Pt endorses good control at home when on bp meds (ran out 1 wk ago). No changes indicated.

## 2014-12-28 NOTE — Assessment & Plan Note (Signed)
Improved with better bp control.

## 2014-12-29 ENCOUNTER — Encounter: Payer: Self-pay | Admitting: *Deleted

## 2014-12-29 ENCOUNTER — Telehealth: Payer: Self-pay | Admitting: Family Medicine

## 2014-12-29 NOTE — Telephone Encounter (Signed)
emmi emailed °

## 2015-02-10 ENCOUNTER — Other Ambulatory Visit: Payer: Self-pay | Admitting: Family Medicine

## 2015-03-26 ENCOUNTER — Other Ambulatory Visit: Payer: Self-pay | Admitting: Family Medicine

## 2015-03-26 NOTE — Telephone Encounter (Signed)
Last filled 09/23/14--please advise

## 2015-07-24 ENCOUNTER — Other Ambulatory Visit: Payer: Self-pay | Admitting: Family Medicine

## 2015-07-25 NOTE — Telephone Encounter (Signed)
Ok to refill 

## 2015-11-21 ENCOUNTER — Ambulatory Visit (INDEPENDENT_AMBULATORY_CARE_PROVIDER_SITE_OTHER): Payer: 59 | Admitting: Family Medicine

## 2015-11-21 ENCOUNTER — Encounter: Payer: Self-pay | Admitting: Family Medicine

## 2015-11-21 VITALS — BP 164/118 | HR 110 | Temp 98.4°F | Wt 265.8 lb

## 2015-11-21 DIAGNOSIS — R079 Chest pain, unspecified: Secondary | ICD-10-CM

## 2015-11-21 DIAGNOSIS — Z23 Encounter for immunization: Secondary | ICD-10-CM | POA: Diagnosis not present

## 2015-11-21 DIAGNOSIS — Z630 Problems in relationship with spouse or partner: Secondary | ICD-10-CM

## 2015-11-21 DIAGNOSIS — I10 Essential (primary) hypertension: Secondary | ICD-10-CM

## 2015-11-21 DIAGNOSIS — E669 Obesity, unspecified: Secondary | ICD-10-CM | POA: Diagnosis not present

## 2015-11-21 MED ORDER — LISINOPRIL 20 MG PO TABS: 20.0000 mg | ORAL_TABLET | Freq: Every day | ORAL | Status: DC

## 2015-11-21 MED ORDER — LORAZEPAM 0.5 MG PO TABS: 0.2500 mg | ORAL_TABLET | Freq: Two times a day (BID) | ORAL | Status: DC | PRN

## 2015-11-21 MED ORDER — ASPIRIN EC 81 MG PO TBEC: 81.0000 mg | DELAYED_RELEASE_TABLET | Freq: Every day | ORAL | Status: DC

## 2015-11-21 NOTE — Progress Notes (Signed)
BP 164/118 mmHg  Pulse 110  Temp(Src) 98.4 F (36.9 C) (Oral)  Wt 265 lb 12.8 oz (120.566 kg)  SpO2 98%   CC: BP check  Subjective:    Patient ID: Jeffery Banks, male    DOB: 1971/05/09, 45 y.o.   MRN: [id number]  HPI: Jeffery Banks is a 45 y.o. male presenting on 11/21/2015 for Hypertension   HTN with chest pain - Compliant with current antihypertensive regimen of lisinopril  , hctz  daily. However over the last few weeks noticing episodes of dizziness, lightheadedness, shaking, sweating and chest pain, tingling in the fingers/toes. Chest pain episodes occurred twice - sharp pain that lasted a few minutes while cooking, resolved with rest. Some pressure/tightness as well with some dyspnea. No nausea or radiation down arm or up jaw. Does check blood pressures at home weekly: bp has been elevated 150/110 over last 2 months. 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 Denies HA, vision changes, SOB, swelling. No recent headaches. No current chest pain.   Has 3 children (9, 6, 58 yo).  Sedentary lifestyle.  fmhx CAD - maternal grandfather  Relevant past medical, surgical, family and social history reviewed and updated as indicated. Interim medical history since our last visit reviewed. Allergies and medications reviewed and updated. Current Outpatient Prescriptions on File Prior to Visit  Medication Sig  . cyclobenzaprine (FLEXERIL) 10 MG tablet TAKE 1 TABLET (10 MG TOTAL) BY MOUTH 3 (THREE) TIMES DAILY AS NEEDED.  . hydrochlorothiazide (HYDRODIURIL) 25 MG tablet TAKE 1 TABLET (25 MG TOTAL) BY MOUTH DAILY.  Marland Kitchen Omeprazole-Sodium Bicarbonate (ZEGERID) 20-1100 MG CAPS capsule Take 1 capsule by mouth daily before breakfast.   No current facility-administered medications on file prior to visit.    Review of Systems Per HPI unless specifically indicated in ROS section     Objective:    BP 164/118 mmHg   Pulse 110  Temp(Src) 98.4 F (36.9 C) (Oral)  Wt 265 lb 12.8 oz (120.566 kg)  SpO2 98%  Wt Readings from Last 3 Encounters:  11/21/15 265 lb 12.8 oz (120.566 kg)  12/28/14 248 lb 6.4 oz (112.674 kg)  10/30/13 254 lb 8 oz (115.44 kg)   Body mass index is 33.22 kg/(m^2).  Physical Exam  Constitutional: He appears well-developed and well-nourished. No distress.  HENT:  Head: Normocephalic and atraumatic.  Mouth/Throat: Oropharynx is clear and moist. No oropharyngeal exudate.  Eyes: Conjunctivae and EOM are normal. Pupils are equal, round, and reactive to light. No scleral icterus.  Neck: Neck supple. No thyromegaly present.  Cardiovascular: Normal rate, regular rhythm, normal heart sounds and intact distal pulses.   No murmur heard. Pulmonary/Chest: Effort normal and breath sounds normal. No respiratory distress. He has no wheezes. He has no rales.  Musculoskeletal: He exhibits no edema.  Skin: Skin is warm and dry. No rash noted.  Psychiatric: His mood appears anxious.  Nursing note and vitals reviewed.  Results for orders placed or performed in visit on 12/28/14  Lipid panel  Result Value Ref Range   Cholesterol 179 0 - 200 mg/dL   Triglycerides 409.8 (H) 0.0 - 149.0 mg/dL   HDL 11.91 >47.82 mg/dL   VLDL 95.6 (H) 0.0 - 21.3 mg/dL   Total CHOL/HDL Ratio 4    NonHDL 130.10   Comprehensive metabolic panel  Result Value Ref Range   Sodium 141 135 - 145 mEq/L   Potassium 4.3 3.5 -  5.1 mEq/L   Chloride 108 96 - 112 mEq/L   CO2 30 19 - 32 mEq/L   Glucose, Bld 101 (H) 70 - 99 mg/dL   BUN 20 6 - 23 mg/dL   Creatinine, Ser 1.61 0.40 - 1.50 mg/dL   Total Bilirubin 0.5 0.2 - 1.2 mg/dL   Alkaline Phosphatase 54 39 - 117 U/L   AST 25 0 - 37 U/L   ALT 30 0 - 53 U/L   Total Protein 6.8 6.0 - 8.3 g/dL   Albumin 4.4 3.5 - 5.2 g/dL   Calcium 9.4 8.4 - 09.6 mg/dL   GFR 04.54 >09.81 mL/min  LDL cholesterol, direct  Result Value Ref Range   Direct LDL 102.0 mg/dL      Assessment &  Plan:   Problem List Items Addressed This Visit    Stress due to marital problems    Reviewed recent stressors, support provided.  He is already established with weekly counseling through work program. Anticipate chest pain partly stemming from recent stressors, ?anxiety attacks. Discussed PRN anxiety medication - will prescribe lorazepam 0.25-0.5mg  to take PRN. Discussed addiction/abuse potential, dependence and tolerance potential of medication.  RTC 4-6 wks f/u visit. Pt agrees with plan.      Obesity, Class I, BMI 30-34.9   HTN (hypertension)    Increase lisinopril to  daily, continue hctz  daily.  Check labs today.      Relevant Medications   lisinopril (PRINIVIL,ZESTRIL) 20 MG tablet   aspirin EC 81 MG tablet   Other Relevant Orders   TSH   Basic metabolic panel   Lipid panel   Microalbumin / creatinine urine ratio   Ambulatory referral to Cardiology   Chest pain - Primary    New over last few weeks associated with worsening hypertension, dyspnea and tachypalpitations, in setting of worsening family stress (unexpected separation from wife). Cardiac risk factors include obesity, sedentary lifestyle, hypertension and family history. Anticipate stress/anxiety related chest pain, but did recommend referral to cardiology to discuss possible stress test. In interim, start aspirin  daily. Pt agrees with plan. EKG today - sinus tachycardia rate 110s, normal axis, intervals, no acute ST/T changes      Relevant Orders   EKG 12-Lead (Completed)   TSH   Ambulatory referral to Cardiology       Follow up plan: Return in about 4 weeks (around 12/19/2015), or as needed, for follow up visit.

## 2015-11-21 NOTE — Patient Instructions (Addendum)
Blood work today We will refer you to heart doctor for further evaluation and possible stress test. Start aspirin  daily. EKG looking ok today. Trial nerve medicine called lorazepam 1/2-1 tablet as needed.  Increase lisinopril to  daily (new dose at pharmacy). Return to see me in 4-6 weeks for follow up. Continue seeing counselor. Hang in there! Flu shot today

## 2015-11-21 NOTE — Addendum Note (Signed)
Addended by: Annamarie Major on: 11/21/2015 05:34 PM   Modules accepted: Orders

## 2015-11-21 NOTE — Assessment & Plan Note (Signed)
Reviewed recent stressors, support provided.  He is already established with weekly counseling through work program. Anticipate chest pain partly stemming from recent stressors, ?anxiety attacks. Discussed PRN anxiety medication - will prescribe lorazepam 0.25-0.5mg  to take PRN. Discussed addiction/abuse potential, dependence and tolerance potential of medication.  RTC 4-6 wks f/u visit. Pt agrees with plan.

## 2015-11-21 NOTE — Assessment & Plan Note (Signed)
Increase lisinopril to  daily, continue hctz  daily.  Check labs today.

## 2015-11-21 NOTE — Assessment & Plan Note (Addendum)
New over last few weeks associated with worsening hypertension, dyspnea and tachypalpitations, in setting of worsening family stress (unexpected separation from wife). Cardiac risk factors include obesity, sedentary lifestyle, hypertension and family history. Anticipate stress/anxiety related chest pain, but did recommend referral to cardiology to discuss possible stress test. In interim, start aspirin  daily. Pt agrees with plan. EKG today - sinus tachycardia rate 110s, normal axis, intervals, no acute ST/T changes

## 2015-11-22 ENCOUNTER — Encounter: Payer: Self-pay | Admitting: Cardiovascular Disease

## 2015-11-22 ENCOUNTER — Ambulatory Visit (INDEPENDENT_AMBULATORY_CARE_PROVIDER_SITE_OTHER): Payer: 59 | Admitting: Cardiovascular Disease

## 2015-11-22 ENCOUNTER — Telehealth: Payer: Self-pay | Admitting: *Deleted

## 2015-11-22 VITALS — BP 160/102 | HR 109 | Ht 76.0 in | Wt 262.2 lb

## 2015-11-22 DIAGNOSIS — R0602 Shortness of breath: Secondary | ICD-10-CM | POA: Diagnosis not present

## 2015-11-22 DIAGNOSIS — I10 Essential (primary) hypertension: Secondary | ICD-10-CM

## 2015-11-22 DIAGNOSIS — R079 Chest pain, unspecified: Secondary | ICD-10-CM | POA: Diagnosis not present

## 2015-11-22 DIAGNOSIS — R000 Tachycardia, unspecified: Secondary | ICD-10-CM | POA: Diagnosis not present

## 2015-11-22 LAB — MICROALBUMIN / CREATININE URINE RATIO
Creatinine,U: 31.8 mg/dL
MICROALB/CREAT RATIO: 2.2 mg/g (ref 0.0–30.0)
Microalb, Ur: <0.7 mg/dL (ref 0.0–1.9)

## 2015-11-22 LAB — BASIC METABOLIC PANEL
BUN: 13 mg/dL (ref 6–23)
CHLORIDE: 101 meq/L (ref 96–112)
CO2: 27 mEq/L (ref 19–32)
Calcium: 9.9 mg/dL (ref 8.4–10.5)
Creatinine, Ser: 1.25 mg/dL (ref 0.40–1.50)
GFR: 67.12 mL/min (ref >60.00)
Glucose, Bld: 88 mg/dL (ref 70–99)
POTASSIUM: 3.6 meq/L (ref 3.5–5.1)
Sodium: 138 mEq/L (ref 135–145)

## 2015-11-22 LAB — LIPID PANEL
CHOLESTEROL: 236 mg/dL — AB (ref 0–200)
HDL: 46.3 mg/dL (ref >39.00)
NONHDL: 190.02
TRIGLYCERIDES: 318 mg/dL — AB (ref 0.0–149.0)
Total CHOL/HDL Ratio: 5
VLDL: 63.6 mg/dL — ABNORMAL HIGH (ref 0.0–40.0)

## 2015-11-22 LAB — TSH: TSH: 1.55 u[IU]/mL (ref 0.35–4.50)

## 2015-11-22 LAB — LDL CHOLESTEROL, DIRECT: LDL DIRECT: 158 mg/dL

## 2015-11-22 NOTE — Telephone Encounter (Signed)
Per Harriett Sine, put pt on to see Dr. Kirke Corin today @ 3:15. Thank you!

## 2015-11-22 NOTE — Progress Notes (Signed)
Primary care physician: Dr. Sharen Hones.  HPI  Jeffery Banks is a pleasant 45 year old man who was referred by Dr. Sharen Hones for evaluation of chest pain. The patient has known history of hypertension since he was in his 54s. His blood pressure has been reasonably controlled up until recently. Other medical problems include hyperlipidemia and obesity. He is not a smoker and has no family history of premature coronary artery disease.  Over the last 6 weeks, he has experienced intermittent episodes of substernal chest pain described as sharp discomfort with no radiation. This happens at rest and not with activities. It is associated with dizziness, tingling in both hands, sweating and a very anxious feeling. He has noted increased exertional dyspnea. He reports being under significant stress over the last year as he is going through separation and he is also stressed at work. He was seen by Dr. Sharen Hones yesterday and was noted to have elevated blood pressure. Lisinopril was increased to 20 mg daily. His labs were overall unremarkable.  No Known Allergies   Current Outpatient Prescriptions on File Prior to Visit  Medication Sig Dispense Refill  . aspirin EC 81 MG tablet Take 1 tablet (81 mg total) by mouth daily.    . cyclobenzaprine (FLEXERIL) 10 MG tablet TAKE 1 TABLET (10 MG TOTAL) BY MOUTH 3 (THREE) TIMES DAILY AS NEEDED. 30 tablet 0  . hydrochlorothiazide (HYDRODIURIL) 25 MG tablet TAKE 1 TABLET (25 MG TOTAL) BY MOUTH DAILY. 90 tablet 3  . lisinopril (PRINIVIL,ZESTRIL) 20 MG tablet Take 1 tablet (20 mg total) by mouth daily. 90 tablet 3  . LORazepam (ATIVAN) 0.5 MG tablet Take 0.5-1 tablets (0.25-0.5 mg total) by mouth 2 (two) times daily as needed for anxiety. 20 tablet 0  . Omeprazole-Sodium Bicarbonate (ZEGERID) 20-1100 MG CAPS capsule Take 1 capsule by mouth daily before breakfast. 30 capsule 3   No current facility-administered medications on file prior to visit.     Past Medical History    Diagnosis Date  . Hx of migraines     as child  . Generalized headaches     daily  . HTN (hypertension)   . GERD (gastroesophageal reflux disease)   . Alcohol abuse, in remission college  . Obesity     borderline     Past Surgical History  Procedure Laterality Date  . Appendectomy  1982     Family History  Problem Relation Age of Onset  . Hypertension Mother   . Cancer Maternal Aunt     beast  . Hypertension Maternal Grandfather   . Coronary artery disease Maternal Grandfather 60  . Hypertension Paternal Grandfather   . Stroke Neg Hx   . Diabetes Neg Hx   . CAD Other     paternal side     Social History   Social History  . Marital Status: Married    Spouse Name: N/A  . Number of Children: N/A  . Years of Education: N/A   Occupational History  . Not on file.   Social History Main Topics  . Smoking status: Former Smoker    Quit date: 10/22/2010  . Smokeless tobacco: Never Used  . Alcohol Use: No  . Drug Use: No  . Sexual Activity: Not on file   Other Topics Concern  . Not on file   Social History Narrative   Caffeine: coffee in am   Lives with wife and 3 children   Occupation: Transport planner for [employer]   Edu: Associate's degree   Activity: walking  at night   Diet: water daily, some fruits/vegetables, red meat 2x/wk, fish 1x/mo     ROS A 10 point review of system was performed. It is negative other than that mentioned in the history of present illness.   PHYSICAL EXAM   BP 160/102 mmHg  Pulse 109  Ht  (1.93 m)  Wt 262 lb 4 oz (118.956 kg)  BMI 31.94 kg/m2  Constitutional: He is oriented to person, place, and time. He appears well-developed and well-nourished. No distress.  HENT: No nasal discharge.  Head: Normocephalic and atraumatic.  Eyes: Pupils are equal and round.  No discharge. Neck: Normal range of motion. Neck supple. No JVD present. No thyromegaly present.  Cardiovascular: Tachycardic, regular rhythm, normal heart sounds.  Exam reveals no gallop and no friction rub. No murmur heard.  Pulmonary/Chest: Effort normal and breath sounds normal. No stridor. No respiratory distress. He has no wheezes. He has no rales. He exhibits no tenderness.  Abdominal: Soft. Bowel sounds are normal. He exhibits no distension. There is no tenderness. There is no rebound and no guarding.  Musculoskeletal: Normal range of motion. He exhibits no edema and no tenderness.  Neurological: He is alert and oriented to person, place, and time. Coordination normal.  Skin: Skin is warm and dry. No rash noted. He is not diaphoretic. No erythema. No pallor.  Psychiatric: He has a normal mood and affect. His behavior is normal. Judgment and thought content normal.      EKG: Sinus tachycardia with no significant ST or T wave changes.   ASSESSMENT AND PLAN

## 2015-11-22 NOTE — Assessment & Plan Note (Signed)
He is already on hydrochlorothiazide. Lisinopril was increased yesterday to 20 mg once daily. If blood pressure continues to be elevated, consider adding carvedilol especially with his resting tachycardia.

## 2015-11-22 NOTE — Patient Instructions (Signed)
Medication Instructions:  Your physician recommends that you continue on your current medications as directed. Please refer to the Current Medication list given to you today.   Labwork: none  Testing/Procedures: Your physician has requested that you have an echocardiogram. Echocardiography is a painless test that uses sound waves to create images of your heart. It provides your doctor with information about the size and shape of your heart and how well your heart's chambers and valves are working. This procedure takes approximately one hour. There are no restrictions for this procedure.  Your physician has requested that you have an exercise tolerance test. For further information please visit [email or web address] Please also follow instruction sheet, as given.    Follow-Up: Your physician recommends that you schedule a follow-up appointment as needed.    Any Other Special Instructions Will Be Listed Below (If Applicable).     If you need a refill on your cardiac medications before your next appointment, please call your pharmacy.  Echocardiogram An echocardiogram, or echocardiography, uses sound waves (ultrasound) to produce an image of your heart. The echocardiogram is simple, painless, obtained within a short period of time, and offers valuable information to your health care provider. The images from an echocardiogram can provide information such as:  Evidence of coronary artery disease (CAD).  Heart size.  Heart muscle function.  Heart valve function.  Aneurysm detection.  Evidence of a past heart attack.  Fluid buildup around the heart.  Heart muscle thickening.  Assess heart valve function. LET [facility] CARE PROVIDER KNOW ABOUT:  Any allergies you have.  All medicines you are taking, including vitamins, herbs, eye drops, creams, and over-the-counter medicines.  Previous problems you or members of your family have had with the use of anesthetics.  Any  blood disorders you have.  Previous surgeries you have had.  Medical conditions you have.  Possibility of pregnancy, if this applies. BEFORE THE PROCEDURE  No special preparation is needed. Eat and drink normally.  PROCEDURE   In order to produce an image of your heart, gel will be applied to your chest and a wand-like tool (transducer) will be moved over your chest. The gel will help transmit the sound waves from the transducer. The sound waves will harmlessly bounce off your heart to allow the heart images to be captured in real-time motion. These images will then be recorded.  You may need an IV to receive a medicine that improves the quality of the pictures. AFTER THE PROCEDURE You may return to your normal schedule including diet, activities, and medicines, unless your health care provider tells you otherwise.   This information is not intended to replace advice given to you by your health care provider. Make sure you discuss any questions you have with your health care provider.   Document Released: 10/05/2000 Document Revised: 10/29/2014 Document Reviewed: 06/15/2013 Elsevier Interactive Patient Education 2016 [employer]. Exercise Stress Electrocardiogram An exercise stress electrocardiogram is a test to check how blood flows to your heart. It is done to find areas of poor blood flow. You will need to walk on a treadmill for this test. The electrocardiogram will record your heartbeat when you are at rest and when you are exercising. BEFORE THE PROCEDURE  Do not have drinks with caffeine or foods with caffeine for 24 hours before the test, or as told by your doctor. This includes coffee, tea (even decaf tea), sodas, chocolate, and cocoa.  Follow your doctor's instructions about eating and drinking before the test.  Ask your doctor what medicines you should or should not take before the test. Take your medicines with water unless told by your doctor not to.  If you use an  inhaler, bring it with you to the test.  Bring a snack to eat after the test.  Do not  smoke for 4 hours before the test.  Do not put lotions, powders, creams, or oils on your chest before the test.  Wear comfortable shoes and clothing. PROCEDURE  You will have patches put on your chest. Small areas of your chest may need to be shaved. Wires will be connected to the patches.  Your heart rate will be watched while you are resting and while you are exercising.  You will walk on the treadmill. The treadmill will slowly get faster to raise your heart rate.  The test will take about 1-2 hours. AFTER THE PROCEDURE  Your heart rate and blood pressure will be watched after the test.  You may return to your normal diet, activities, and medicines or as told by your doctor.   This information is not intended to replace advice given to you by your health care provider. Make sure you discuss any questions you have with your health care provider.   Document Released: 03/26/2008 Document Revised: 10/29/2014 Document Reviewed: 06/15/2013 Elsevier Interactive Patient Education [employer].

## 2015-11-22 NOTE — Assessment & Plan Note (Signed)
The patient might be having panic attacks related to significant stress. He does report worsening exertional dyspnea in addition to chest pain. Thus, I requested an echocardiogram to ensure no structural heart abnormalities. I also requested a treadmill stress test to be done next week. Hopefully his blood pressure will be down by then.

## 2015-11-22 NOTE — Telephone Encounter (Signed)
Mar[facility] at [facility] called and needs a patient in stat. There is a ekg done 11/21/15. Please let me know what I need to do?

## 2015-11-28 ENCOUNTER — Encounter: Payer: Self-pay | Admitting: *Deleted

## 2015-11-30 ENCOUNTER — Other Ambulatory Visit: Payer: 59

## 2015-12-30 ENCOUNTER — Telehealth: Payer: Self-pay | Admitting: Cardiovascular Disease

## 2015-12-30 NOTE — Telephone Encounter (Signed)
Pt scheduled echo and GXT for Feb 8. Cancelled d/t being out of town.  Left message on pt home VM to call office.

## 2016-01-02 NOTE — Telephone Encounter (Signed)
Left message on machine for patient to contact the office.   

## 2016-01-03 NOTE — Telephone Encounter (Signed)
Left message on machine for patient to contact the office regarding rescheduling GXT and echo Letter sent

## 2016-01-04 ENCOUNTER — Other Ambulatory Visit: Payer: Self-pay | Admitting: Family Medicine

## 2016-01-11 ENCOUNTER — Other Ambulatory Visit: Payer: Self-pay | Admitting: Family Medicine

## 2016-01-12 NOTE — Telephone Encounter (Signed)
Rx called in to pharmacy. 

## 2016-01-12 NOTE — Telephone Encounter (Signed)
Ok to refill in Dr. Timoteo ExposeG's absence? Last filled 11/21/15 #20 0RF

## 2016-01-12 NOTE — Telephone Encounter (Signed)
Ok to phone in Ativan 

## 2016-02-09 ENCOUNTER — Other Ambulatory Visit: Payer: Self-pay | Admitting: Family Medicine

## 2016-02-09 NOTE — Telephone Encounter (Signed)
Ok to refill 

## 2016-04-10 ENCOUNTER — Other Ambulatory Visit: Payer: Self-pay | Admitting: Family Medicine

## 2016-04-10 NOTE — Telephone Encounter (Signed)
Ok to refill? Last filled 02/10/16 #30 0RF.

## 2016-04-10 NOTE — Telephone Encounter (Signed)
Please refill times one in PCP abscence  

## 2016-05-15 ENCOUNTER — Other Ambulatory Visit: Payer: Self-pay | Admitting: Internal Medicine

## 2016-05-15 NOTE — Telephone Encounter (Signed)
Last filled 01/12/15 by Sherley Bounds advise

## 2016-05-15 NOTE — Telephone Encounter (Signed)
plz phone in. 

## 2016-05-16 NOTE — Telephone Encounter (Signed)
Rx called in as directed.   

## 2016-11-07 ENCOUNTER — Other Ambulatory Visit: Payer: Self-pay | Admitting: Family Medicine

## 2016-11-09 ENCOUNTER — Other Ambulatory Visit: Payer: Self-pay | Admitting: Family Medicine

## 2016-12-20 ENCOUNTER — Encounter: Payer: Self-pay | Admitting: *Deleted

## 2016-12-20 ENCOUNTER — Other Ambulatory Visit: Payer: Self-pay | Admitting: Family Medicine

## 2017-01-04 ENCOUNTER — Other Ambulatory Visit: Payer: Self-pay | Admitting: Family Medicine

## 2017-01-22 ENCOUNTER — Other Ambulatory Visit: Payer: Self-pay | Admitting: Family Medicine

## 2017-04-12 ENCOUNTER — Other Ambulatory Visit: Payer: Self-pay

## 2017-04-12 MED ORDER — LISINOPRIL 20 MG PO TABS: 20.0000 mg | ORAL_TABLET | Freq: Every day | ORAL | 0 refills | Status: DC

## 2017-04-15 ENCOUNTER — Ambulatory Visit (INDEPENDENT_AMBULATORY_CARE_PROVIDER_SITE_OTHER): Payer: BLUE CROSS/BLUE SHIELD | Admitting: Family Medicine

## 2017-04-15 ENCOUNTER — Encounter: Payer: Self-pay | Admitting: Family Medicine

## 2017-04-15 VITALS — BP 138/94 | HR 72 | Temp 98.2°F | Ht 74.5 in | Wt 262.0 lb

## 2017-04-15 DIAGNOSIS — E669 Obesity, unspecified: Secondary | ICD-10-CM

## 2017-04-15 DIAGNOSIS — Z789 Other specified health status: Secondary | ICD-10-CM

## 2017-04-15 DIAGNOSIS — H9202 Otalgia, left ear: Secondary | ICD-10-CM

## 2017-04-15 DIAGNOSIS — I10 Essential (primary) hypertension: Secondary | ICD-10-CM | POA: Diagnosis not present

## 2017-04-15 DIAGNOSIS — K219 Gastro-esophageal reflux disease without esophagitis: Secondary | ICD-10-CM | POA: Diagnosis not present

## 2017-04-15 DIAGNOSIS — E785 Hyperlipidemia, unspecified: Secondary | ICD-10-CM

## 2017-04-15 DIAGNOSIS — Z0001 Encounter for general adult medical examination with abnormal findings: Secondary | ICD-10-CM | POA: Diagnosis not present

## 2017-04-15 LAB — LIPID PANEL
Cholesterol: 203 mg/dL — ABNORMAL HIGH (ref 0–200)
HDL: 43.3 mg/dL (ref >39.00)
LDL CALC: 133 mg/dL — AB (ref 0–99)
NONHDL: 159.31
TRIGLYCERIDES: 131 mg/dL (ref 0.0–149.0)
Total CHOL/HDL Ratio: 5
VLDL: 26.2 mg/dL (ref 0.0–40.0)

## 2017-04-15 LAB — MICROALBUMIN / CREATININE URINE RATIO
Creatinine,U: 79.4 mg/dL
MICROALB UR: 0.7 mg/dL (ref 0.0–1.9)
Microalb Creat Ratio: 0.9 mg/g (ref 0.0–30.0)

## 2017-04-15 LAB — TSH: TSH: 1.66 u[IU]/mL (ref 0.35–4.50)

## 2017-04-15 LAB — BASIC METABOLIC PANEL
BUN: 18 mg/dL (ref 6–23)
CHLORIDE: 105 meq/L (ref 96–112)
CO2: 30 mEq/L (ref 19–32)
CREATININE: 1.2 mg/dL (ref 0.40–1.50)
Calcium: 9.7 mg/dL (ref 8.4–10.5)
GFR: 69.9 mL/min (ref >60.00)
Glucose, Bld: 92 mg/dL (ref 70–99)
POTASSIUM: 4.1 meq/L (ref 3.5–5.1)
Sodium: 140 mEq/L (ref 135–145)

## 2017-04-15 MED ORDER — LISINOPRIL-HYDROCHLOROTHIAZIDE 10-12.5 MG PO TABS: 1.0000 | ORAL_TABLET | Freq: Every day | ORAL | 3 refills | Status: DC

## 2017-04-15 MED ORDER — NEOMYCIN-POLYMYXIN-HC 1 % OT SOLN: 3.0000 [drp] | Freq: Three times a day (TID) | OTIC | 0 refills | Status: DC

## 2017-04-15 MED ORDER — AMOXICILLIN-POT CLAVULANATE 875-125 MG PO TABS: 1.0000 | ORAL_TABLET | Freq: Two times a day (BID) | ORAL | 0 refills | Status: AC

## 2017-04-15 NOTE — Assessment & Plan Note (Signed)
Continues OTC zegerid.

## 2017-04-15 NOTE — Assessment & Plan Note (Signed)
Preventative protocols reviewed and updated unless pt declined. Discussed healthy diet and lifestyle.  

## 2017-04-15 NOTE — Assessment & Plan Note (Addendum)
Acute left ear pain - exam consistent with external otitis and possible otitis media - treat with augmentin course, as well as cortisporin otic drops. Update if not improving with treatment. No perf seen today.

## 2017-04-15 NOTE — Assessment & Plan Note (Signed)
H/o high triglycerides - update FLP today.

## 2017-04-15 NOTE — Patient Instructions (Addendum)
Labs today Ear irrigation bilaterally today. You do have swimmer's ear with possible middle ear infection as well - treat with antibiotic drops and antibiotics by mouth. Let us know if not improving with treatment.  Restart combo pill lisinopril hctz 10/12.5mg  daily. Monitor blood pressures and if staying elevated let me know to send in previous higher dose.  Return in 3 months for blood pressure follow up   Health Maintenance, Male A healthy lifestyle and preventive care is important for your health and wellness. Ask your health care provider about what schedule of regular examinations is right for you. What should I know about weight and diet? Eat a Healthy Diet  Eat plenty of vegetables, fruits, whole grains, low-fat dairy products, and lean protein.  Do not eat a lot of foods high in solid fats, added sugars, or salt.  Maintain a Healthy Weight Regular exercise can help you achieve or maintain a healthy weight. You should:  Do at least 150 minutes of exercise each week. The exercise should increase your heart rate and make you sweat (moderate-intensity exercise).  Do strength-training exercises at least twice a week.  Watch Your Levels of Cholesterol and Blood Lipids  Have your blood tested for lipids and cholesterol every 5 years starting at 44 years of age. If you are at high risk for heart disease, you should start having your blood tested when you are 44 years old. You may need to have your cholesterol levels checked more often if: ? Your lipid or cholesterol levels are high. ? You are older than 44 years of age. ? You are at high risk for heart disease.  What should I know about cancer screening? Many types of cancers can be detected early and may often be prevented. Lung Cancer  You should be screened every year for lung cancer if: ? You are a current smoker who has smoked for at least 30 years. ? You are a former smoker who has quit within the past 15 years.  Talk to  your health care provider about your screening options, when you should start screening, and how often you should be screened.  Colorectal Cancer  Routine colorectal cancer screening usually begins at 44 years of age and should be repeated every 5-10 years until you are 44 years old. You may need to be screened more often if early forms of precancerous polyps or small growths are found. Your health care provider may recommend screening at an earlier age if you have risk factors for colon cancer.  Your health care provider may recommend using home test kits to check for hidden blood in the stool.  A small camera at the end of a tube can be used to examine your colon (sigmoidoscopy or colonoscopy). This checks for the earliest forms of colorectal cancer.  Prostate and Testicular Cancer  Depending on your age and overall health, your health care provider may do certain tests to screen for prostate and testicular cancer.  Talk to your health care provider about any symptoms or concerns you have about testicular or prostate cancer.  Skin Cancer  Check your skin from head to toe regularly.  Tell your health care provider about any new moles or changes in moles, especially if: ? There is a change in a mole's size, shape, or color. ? You have a mole that is larger than a pencil eraser.  Always use sunscreen. Apply sunscreen liberally and repeat throughout the day.  Protect yourself by wearing long sleeves, pants,  a wide-brimmed hat, and sunglasses when outside.  What should I know about heart disease, diabetes, and high blood pressure?  If you are 45-55 years of age, have your blood pressure checked every 3-5 years. If you are 78 years of age or older, have your blood pressure checked every year. You should have your blood pressure measured twice-once when you are at a hospital or clinic, and once when you are not at a hospital or clinic. Record the average of the two measurements. To check  your blood pressure when you are not at a hospital or clinic, you can use: ? An automated blood pressure machine at a pharmacy. ? A home blood pressure monitor.  Talk to your health care provider about your target blood pressure.  If you are between 29-64 years old, ask your health care provider if you should take aspirin to prevent heart disease.  Have regular diabetes screenings by checking your fasting blood sugar level. ? If you are at a normal weight and have a low risk for diabetes, have this test once every three years after the age of 68. ? If you are overweight and have a high risk for diabetes, consider being tested at a younger age or more often.  A one-time screening for abdominal aortic aneurysm (AAA) by ultrasound is recommended for men aged 65-75 years who are current or former smokers. What should I know about preventing infection? Hepatitis B If you have a higher risk for hepatitis B, you should be screened for this virus. Talk with your health care provider to find out if you are at risk for hepatitis B infection. Hepatitis C Blood testing is recommended for:  Everyone born from [birth year] through 1963.  Anyone with known risk factors for hepatitis C.  Sexually Transmitted Diseases (STDs)  You should be screened each year for STDs including gonorrhea and chlamydia if: ? You are sexually active and are younger than 44 years of age. ? You are older than 44 years of age and your health care provider tells you that you are at risk for this type of infection. ? Your sexual activity has changed since you were last screened and you are at an increased risk for chlamydia or gonorrhea. Ask your health care provider if you are at risk.  Talk with your health care provider about whether you are at high risk of being infected with HIV. Your health care provider may recommend a prescription medicine to help prevent HIV infection.  What else can I do?  Schedule regular health, dental,  and eye exams.  Stay current with your vaccines (immunizations).  Do not use any tobacco products, such as cigarettes, chewing tobacco, and e-cigarettes. If you need help quitting, ask your health care provider.  Limit alcohol intake to no more than 2 drinks per day. One drink equals 12 ounces of beer, 5 ounces of wine, or 1 ounces of hard liquor.  Do not use street drugs.  Do not share needles.  Ask your health care provider for help if you need support or information about quitting drugs.  Tell your health care provider if you often feel depressed.  Tell your health care provider if you have ever been abused or do not feel safe at home. This information is not intended to replace advice given to you by your health care provider. Make sure you discuss any questions you have with your health care provider. Document Released: 04/05/2008 Document Revised: 06/06/2016 Document Reviewed: 07/12/2015 Elsevier Interactive  Patient Education  2018 Elsevier Inc.  

## 2017-04-15 NOTE — Assessment & Plan Note (Signed)
Encouraged weight loss through healthy diet and lifestyle changes.  

## 2017-04-15 NOTE — Assessment & Plan Note (Signed)
Chronic, uncontrolled today - pt was out of meds for 3 wks. Will restart combo pill lisinopril hctz 10/12.5mg  daily (prior on lisinopril 20 and hctz 25mg ). RTC 3 mo f/u visit.

## 2017-04-15 NOTE — Progress Notes (Addendum)
BP (!) 138/94   Pulse 72   Temp 98.2 F (36.8 C) (Oral)   Ht 6' 2.5" (1.892 m)   Wt 262 lb (118.8 kg)   SpO2 96%   BMI 33.19 kg/m   On repeat testing, 158/108  CC: CPE Subjective:    Patient ID: Jeffery Banks, male    DOB: Feb 19, 1971, 46 y.o.   MRN: [id number]  HPI: Jeffery GuarneriJason Vincent Banks is a 46 y.o. male presenting on 04/15/2017 for Annual Exam and Ear Pain (left)   Last seen here 10/2015. Would like CPE today. Would like meds refilled today.  HTN - ran out of meds 3 wks ago. He has noted 12 lb weight gain off hctz.   L earache for the past week, started after swimming. No drainage. No significant nasal congestion. No hearing loss.   Preventative: Flu shot yearly Tdap 2013 Seat belt use discussed Sunscreen use discussed. No changing moles on skin Ex smoker quit 2012 Alcohol - seldom  Caffeine: coffee in am Lives with wife and 3 children Occupation: Transport plannersales manager for [employer] Edu: Associate's degree Activity: walking at night 1-2 mi Diet: water daily, some fruits/vegetables   Relevant past medical, surgical, family and social history reviewed and updated as indicated. Interim medical history since our last visit reviewed. Allergies and medications reviewed and updated. Outpatient Medications Prior to Visit  Medication Sig Dispense Refill  . Omeprazole-Sodium Bicarbonate (ZEGERID) 20-1100 MG CAPS capsule Take 1 capsule by mouth daily before breakfast. 30 capsule 3  . aspirin EC 81 MG tablet Take 1 tablet (81 mg total) by mouth daily.    . cyclobenzaprine (FLEXERIL) 10 MG tablet TAKE 1 TABLET (10 MG TOTAL) BY MOUTH 3 (THREE) TIMES DAILY AS NEEDED. 30 tablet 1  . hydrochlorothiazide (HYDRODIURIL) 25 MG tablet TAKE ONE TABLET BY MOUTH DAILY 90 tablet 0  . lisinopril (PRINIVIL,ZESTRIL) 20 MG tablet Take 1 tablet (20 mg total) by mouth daily. 3 tablet 0  . LORazepam (ATIVAN) 0.5 MG tablet TAKE 1/2-1 TABLET BY MOUTH TWICE DAILY AS NEEDED 20 tablet 0   No  facility-administered medications prior to visit.      Per HPI unless specifically indicated in ROS section below Review of Systems  Constitutional: Negative for activity change, appetite change, chills, fatigue, fever and unexpected weight change.  HENT: Positive for ear pain. Negative for hearing loss.   Eyes: Negative for visual disturbance.  Respiratory: Negative for cough, chest tightness, shortness of breath and wheezing.   Cardiovascular: Negative for chest pain, palpitations and leg swelling.  Gastrointestinal: Positive for diarrhea (recent, food related). Negative for abdominal distention, abdominal pain, blood in stool, constipation, nausea and vomiting.  Genitourinary: Negative for difficulty urinating and hematuria.  Musculoskeletal: Negative for arthralgias, myalgias and neck pain.  Skin: Negative for rash.  Neurological: Negative for dizziness, seizures, syncope and headaches.  Hematological: Negative for adenopathy. Does not bruise/bleed easily.  Psychiatric/Behavioral: Negative for dysphoric mood. The patient is not nervous/anxious.        Objective:    BP (!) 138/94   Pulse 72   Temp 98.2 F (36.8 C) (Oral)   Ht 6' 2.5" (1.892 m)   Wt 262 lb (118.8 kg)   SpO2 96%   BMI 33.19 kg/m   Wt Readings from Last 3 Encounters:  04/15/17 262 lb (118.8 kg)  11/22/15 262 lb 4 oz (119 kg)  11/21/15 265 lb 12.8 oz (120.6 kg)    Physical Exam  Constitutional: He is oriented to person, place,  and time. He appears well-developed and well-nourished. No distress.  HENT:  Head: Normocephalic and atraumatic.  Right Ear: Hearing, tympanic membrane, external ear and ear canal normal.  Left Ear: Hearing, external ear and ear canal normal.  Nose: Nose normal.  Mouth/Throat: Uvula is midline, oropharynx is clear and moist and mucous membranes are normal. No oropharyngeal exudate, posterior oropharyngeal edema or posterior oropharyngeal erythema.  Cerumen covering bilateral canals  s/p successful irrigation. L superior TM along with deep external canal erythematous, irritated, bulging Good light reflex inferior TM without perforation  Eyes: Conjunctivae and EOM are normal. Pupils are equal, round, and reactive to light. No scleral icterus.  Neck: Normal range of motion. Neck supple. No thyromegaly present.  Cardiovascular: Normal rate, regular rhythm, normal heart sounds and intact distal pulses.   No murmur heard. Pulses:      Radial pulses are 2+ on the right side, and 2+ on the left side.  Pulmonary/Chest: Effort normal and breath sounds normal. No respiratory distress. He has no wheezes. He has no rales.  Abdominal: Soft. Bowel sounds are normal. He exhibits no distension and no mass. There is no tenderness. There is no rebound and no guarding.  Musculoskeletal: Normal range of motion. He exhibits no edema.  Lymphadenopathy:    He has no cervical adenopathy.  Neurological: He is alert and oriented to person, place, and time.  CN grossly intact, station and gait intact  Skin: Skin is warm and dry. No rash noted.  Psychiatric: He has a normal mood and affect. His behavior is normal. Judgment and thought content normal.  Nursing note and vitals reviewed.  Results for orders placed or performed in visit on 11/21/15  TSH  Result Value Ref Range   TSH 1.55 0.35 - 4.50 uIU/mL  Basic metabolic panel  Result Value Ref Range   Sodium 138 135 - 145 mEq/L   Potassium 3.6 3.5 - 5.1 mEq/L   Chloride 101 96 - 112 mEq/L   CO2 27 19 - 32 mEq/L   Glucose, Bld 88 70 - 99 mg/dL   BUN 13 6 - 23 mg/dL   Creatinine, Ser 1.61 0.40 - 1.50 mg/dL   Calcium 9.9 8.4 - 09.6 mg/dL   GFR 04.54 >09.81 mL/min  Lipid panel  Result Value Ref Range   Cholesterol 236 (H) 0 - 200 mg/dL   Triglycerides 191.4 (H) 0.0 - 149.0 mg/dL   HDL 78.29 >56.21 mg/dL   VLDL 30.8 (H) 0.0 - 65.7 mg/dL   Total CHOL/HDL Ratio 5    NonHDL 190.02   Microalbumin / creatinine urine ratio  Result Value Ref  Range   Microalb, Ur <0.7 0.0 - 1.9 mg/dL   Creatinine,U 84.6 mg/dL   Microalb Creat Ratio 2.2 0.0 - 30.0 mg/g  LDL cholesterol, direct  Result Value Ref Range   Direct LDL 158.0 mg/dL      Assessment & Plan:   Problem List Items Addressed This Visit    Dyslipidemia    H/o high triglycerides - update FLP today.       Relevant Orders   Lipid panel   Basic metabolic panel   TSH   GERD (gastroesophageal reflux disease)    Continues OTC zegerid.       Health maintenance alteration - Primary    Preventative protocols reviewed and updated unless pt declined. Discussed healthy diet and lifestyle.       HTN (hypertension)    Chronic, uncontrolled today - pt was out of meds for  3 wks. Will restart combo pill lisinopril hctz 10/12.5mg  daily (prior on lisinopril 20 and hctz 25mg ). RTC 3 mo f/u visit.       Relevant Medications   lisinopril-hydrochlorothiazide (PRINZIDE,ZESTORETIC) 10-12.5 MG tablet   Other Relevant Orders   Basic metabolic panel   Microalbumin / creatinine urine ratio   Left ear pain    Acute left ear pain - exam consistent with external otitis and possible otitis media - treat with augmentin course, as well as cortisporin otic drops. Update if not improving with treatment. No perf seen today.       Obesity, Class I, BMI 30-34.9    Encouraged weight loss through healthy diet and lifestyle changes          Follow up plan: Return in about 3 months (around 07/16/2017) for follow up visit.  Eustaquio Boyden, MD

## 2017-07-16 ENCOUNTER — Ambulatory Visit: Payer: BLUE CROSS/BLUE SHIELD | Admitting: Family Medicine

## 2018-04-04 ENCOUNTER — Other Ambulatory Visit: Payer: Self-pay | Admitting: Family Medicine

## 2018-06-30 ENCOUNTER — Other Ambulatory Visit: Payer: Self-pay | Admitting: Family Medicine

## 2018-08-08 ENCOUNTER — Emergency Department: Payer: BLUE CROSS/BLUE SHIELD

## 2018-08-08 ENCOUNTER — Observation Stay
Admission: EM | Admit: 2018-08-08 | Discharge: 2018-08-09 | Disposition: A | Discharge status: Home / Self Care | Payer: BLUE CROSS/BLUE SHIELD | Attending: Internal Medicine | Admitting: Internal Medicine

## 2018-08-08 DIAGNOSIS — R000 Tachycardia, unspecified: Secondary | ICD-10-CM | POA: Insufficient documentation

## 2018-08-08 DIAGNOSIS — F17210 Nicotine dependence, cigarettes, uncomplicated: Secondary | ICD-10-CM | POA: Diagnosis not present

## 2018-08-08 DIAGNOSIS — K219 Gastro-esophageal reflux disease without esophagitis: Secondary | ICD-10-CM | POA: Diagnosis not present

## 2018-08-08 DIAGNOSIS — E785 Hyperlipidemia, unspecified: Secondary | ICD-10-CM | POA: Insufficient documentation

## 2018-08-08 DIAGNOSIS — E669 Obesity, unspecified: Secondary | ICD-10-CM | POA: Diagnosis not present

## 2018-08-08 DIAGNOSIS — I10 Essential (primary) hypertension: Secondary | ICD-10-CM | POA: Insufficient documentation

## 2018-08-08 DIAGNOSIS — I160 Hypertensive urgency: Secondary | ICD-10-CM | POA: Diagnosis present

## 2018-08-08 DIAGNOSIS — Z6831 Body mass index (BMI) 31.0-31.9, adult: Secondary | ICD-10-CM | POA: Diagnosis not present

## 2018-08-08 DIAGNOSIS — Z8249 Family history of ischemic heart disease and other diseases of the circulatory system: Secondary | ICD-10-CM | POA: Insufficient documentation

## 2018-08-08 DIAGNOSIS — F1011 Alcohol abuse, in remission: Secondary | ICD-10-CM | POA: Diagnosis not present

## 2018-08-08 DIAGNOSIS — G459 Transient cerebral ischemic attack, unspecified: Secondary | ICD-10-CM | POA: Diagnosis not present

## 2018-08-08 DIAGNOSIS — Z7982 Long term (current) use of aspirin: Secondary | ICD-10-CM | POA: Insufficient documentation

## 2018-08-08 LAB — DIFFERENTIAL
Abs Immature Granulocytes: 0.02 10*3/uL (ref 0.00–0.07)
BASOS PCT: 1 %
Basophils Absolute: 0.1 10*3/uL (ref 0.0–0.1)
Eosinophils Absolute: 0.3 10*3/uL (ref 0.0–0.5)
Eosinophils Relative: 5 %
Immature Granulocytes: 0 %
Lymphocytes Relative: 40 %
Lymphs Abs: 2.1 10*3/uL (ref 0.7–4.0)
MONO ABS: 0.5 10*3/uL (ref 0.1–1.0)
MONOS PCT: 9 %
NEUTROS ABS: 2.4 10*3/uL (ref 1.7–7.7)
Neutrophils Relative %: 45 %

## 2018-08-08 LAB — CBC
HCT: 48.5 % (ref 39.0–52.0)
Hemoglobin: 17.5 g/dL — ABNORMAL HIGH (ref 13.0–17.0)
MCH: 33.1 pg (ref 26.0–34.0)
MCHC: 36.1 g/dL — AB (ref 30.0–36.0)
MCV: 91.9 fL (ref 80.0–100.0)
NRBC: 0 % (ref 0.0–0.2)
PLATELETS: 180 10*3/uL (ref 150–400)
RBC: 5.28 MIL/uL (ref 4.22–5.81)
RDW: 12.9 % (ref 11.5–15.5)
WBC: 5.4 10*3/uL (ref 4.0–10.5)

## 2018-08-08 LAB — PROTIME-INR
INR: 0.97
PROTHROMBIN TIME: 12.8 s (ref 11.4–15.2)

## 2018-08-08 LAB — COMPREHENSIVE METABOLIC PANEL
ALK PHOS: 61 U/L (ref 38–126)
ALT: 54 U/L — AB (ref 0–44)
AST: 35 U/L (ref 15–41)
Albumin: 4.9 g/dL (ref 3.5–5.0)
Anion gap: 10 (ref 5–15)
BUN: 17 mg/dL (ref 6–20)
CALCIUM: 9.9 mg/dL (ref 8.9–10.3)
CHLORIDE: 103 mmol/L (ref 98–111)
CO2: 26 mmol/L (ref 22–32)
Creatinine, Ser: 1.13 mg/dL (ref 0.61–1.24)
Glucose, Bld: 116 mg/dL — ABNORMAL HIGH (ref 70–99)
Potassium: 3.8 mmol/L (ref 3.5–5.1)
Sodium: 139 mmol/L (ref 135–145)
TOTAL PROTEIN: 7.7 g/dL (ref 6.5–8.1)
Total Bilirubin: 0.8 mg/dL (ref 0.3–1.2)

## 2018-08-08 LAB — APTT: aPTT: 56 seconds — ABNORMAL HIGH (ref 24–36)

## 2018-08-08 LAB — TROPONIN I

## 2018-08-08 LAB — GLUCOSE, CAPILLARY: Glucose-Capillary: 111 mg/dL — ABNORMAL HIGH (ref 70–99)

## 2018-08-08 MED ORDER — LISINOPRIL 10 MG PO TABS
10.0000 mg | ORAL_TABLET | Freq: Every day | ORAL | Status: DC
  Administered 2018-08-09: 10 mg via ORAL
  Filled 2018-08-08: qty 1

## 2018-08-08 MED ORDER — LISINOPRIL-HYDROCHLOROTHIAZIDE 10-12.5 MG PO TABS: 1.0000 | ORAL_TABLET | Freq: Every day | ORAL | Status: DC

## 2018-08-08 MED ORDER — ASPIRIN 325 MG PO TABS
325.0000 mg | ORAL_TABLET | Freq: Every day | ORAL | Status: DC
  Administered 2018-08-08 – 2018-08-09 (×2): 325 mg via ORAL
  Filled 2018-08-08 (×2): qty 1

## 2018-08-08 MED ORDER — ALTEPLASE 100 MG IV SOLR
INTRAVENOUS | Status: AC
  Filled 2018-08-08: qty 100

## 2018-08-08 MED ORDER — ACETAMINOPHEN 160 MG/5ML PO SOLN
650.0000 mg | ORAL | Status: DC | PRN
  Filled 2018-08-08: qty 20.3

## 2018-08-08 MED ORDER — HYDROCHLOROTHIAZIDE 12.5 MG PO CAPS
12.5000 mg | ORAL_CAPSULE | Freq: Every day | ORAL | Status: DC
  Administered 2018-08-09: 12.5 mg via ORAL
  Filled 2018-08-08: qty 1

## 2018-08-08 MED ORDER — ENOXAPARIN SODIUM 40 MG/0.4ML ~~LOC~~ SOLN: 40.0000 mg | SUBCUTANEOUS | Status: DC

## 2018-08-08 MED ORDER — LABETALOL HCL 5 MG/ML IV SOLN
INTRAVENOUS | Status: AC
  Filled 2018-08-08: qty 4

## 2018-08-08 MED ORDER — ACETAMINOPHEN 650 MG RE SUPP: 650.0000 mg | RECTAL | Status: DC | PRN

## 2018-08-08 MED ORDER — STROKE: EARLY STAGES OF RECOVERY BOOK
Freq: Once | Status: AC
  Administered 2018-08-08: 23:00:00

## 2018-08-08 MED ORDER — ACETAMINOPHEN 325 MG PO TABS: 650.0000 mg | ORAL_TABLET | ORAL | Status: DC | PRN

## 2018-08-08 MED ORDER — METOPROLOL TARTRATE 25 MG PO TABS
25.0000 mg | ORAL_TABLET | Freq: Two times a day (BID) | ORAL | Status: DC
  Administered 2018-08-08 – 2018-08-09 (×2): 25 mg via ORAL
  Filled 2018-08-08 (×2): qty 1

## 2018-08-08 MED ORDER — ASPIRIN 300 MG RE SUPP
300.0000 mg | Freq: Every day | RECTAL | Status: DC
  Filled 2018-08-08: qty 1

## 2018-08-08 NOTE — Progress Notes (Signed)
CODE STROKE- PHARMACY COMMUNICATION   Time CODE STROKE called/page received:10/18 1342  Time response to CODE STROKE was made (in person or via phone):  1349  Time Stroke Kit retrieved from [place] (only if needed):n/a  Name of Provider/Nurse contacted:   Past Medical History:  Diagnosis Date  . Alcohol abuse, in remission college  . Generalized headaches    daily  . GERD (gastroesophageal reflux disease)   . HTN (hypertension)   . Hx of migraines    as child  . Obesity    borderline   Prior to Admission medications   Medication Sig Start Date End Date Taking? Authorizing Provider  lisinopril-hydrochlorothiazide (PRINZIDE,ZESTORETIC) 10-12.5 MG tablet TAKE 1 TABLET BY MOUTH EVERY DAY 06/30/18  Yes Ria Bush, MD  NEOMYCIN-POLYMYXIN-HYDROCORTISONE (CORTISPORIN) 1 % SOLN OTIC solution Place 3 drops into the left ear every 8 (eight) hours. 04/15/17 08/08/18  Ria Bush, MD  Omeprazole-Sodium Bicarbonate (ZEGERID) 20-1100 MG CAPS capsule Take 1 capsule by mouth daily before breakfast. 12/28/14 08/08/18  Ria Bush, MD    Noralee Space ,PharmD Clinical Pharmacist  08/08/2018  2:13 PM

## 2018-08-08 NOTE — ED Triage Notes (Signed)
First Nurse Note:  C/O right arm numbness x 1 hour.  States blood pressure is elevated.  Patient is AAOx3.  MAE equally and strong.  Gait steady. NAD

## 2018-08-08 NOTE — ED Provider Notes (Signed)
[facility] Emergency Department Provider Note    First MD Initiated Contact with Patient 08/08/18 1408     (approximate)  I have reviewed the triage vital signs and the nursing notes.   HISTORY  Chief Complaint RUE tingling   HPI Jaxyn Rout is a 47 y.o. male presents the ER for evaluation of right upper extremity tingling and numbness for 1 hour that occurred while at work.  Denies any headache.  No chest pain or shortness of breath.  Patient took his blood pressure and found it was 240/150.  Due to elevated blood pressure is brought to the ER.  States that the tingling is improving.  Denies any blurry vision.  No headache.  No neck pain nausea or vomiting.  Is never had symptoms like this before.    Past Medical History:  Diagnosis Date  . Alcohol abuse, in remission college  . Generalized headaches    daily  . GERD (gastroesophageal reflux disease)   . HTN (hypertension)   . Hx of migraines    as child  . Obesity    borderline   Family History  Problem Relation Age of Onset  . Hypertension Mother   . Cancer Maternal Aunt        beast  . Hypertension Maternal Grandfather   . Coronary artery disease Maternal Grandfather 60  . Hypertension Paternal Grandfather   . Stroke Neg Hx   . Diabetes Neg Hx   . CAD Other        paternal side   Past Surgical History:  Procedure Laterality Date  . APPENDECTOMY  1982   Patient Active Problem List   Diagnosis Date Noted  . Health maintenance alteration 04/15/2017  . Left ear pain 04/15/2017  . Dyslipidemia 04/15/2017  . Chest pain 11/21/2015  . Stress due to marital problems 11/21/2015  . HTN (hypertension) 01/04/2012  . GERD (gastroesophageal reflux disease)   . Generalized headaches   . Obesity, Class I, BMI 30-34.9       Prior to Admission medications   Medication Sig Start Date End Date Taking? Authorizing Provider  lisinopril-hydrochlorothiazide (PRINZIDE,ZESTORETIC) 10-12.5 MG  tablet TAKE 1 TABLET BY MOUTH EVERY DAY 06/30/18  Yes Eustaquio Boyden, MD    Allergies Patient has no known allergies.    Social History Social History   Tobacco Use  . Smoking status: Former Smoker    Last attempt to quit: 10/22/2010    Years since quitting: 7.8  . Smokeless tobacco: Never Used  Substance Use Topics  . Alcohol use: No  . Drug use: No    Review of Systems Patient denies headaches, rhinorrhea, blurry vision, numbness, shortness of breath, chest pain, edema, cough, abdominal pain, nausea, vomiting, diarrhea, dysuria, fevers, rashes or hallucinations unless otherwise stated above in HPI. ____________________________________________   PHYSICAL EXAM:  VITAL SIGNS: Vitals:   08/08/18 1402  BP: (!) 145/96  Pulse: (!) 111  SpO2: 97%    Constitutional: Alert and oriented.  Eyes: Conjunctivae are normal.  Head: Atraumatic. Nose: No congestion/rhinnorhea. Mouth/Throat: Mucous membranes are moist.   Neck: No stridor. Painless ROM.  Cardiovascular: Normal rate, regular rhythm. Grossly normal heart sounds.  Good peripheral circulation. Respiratory: Normal respiratory effort.  No retractions. Lungs CTAB. Gastrointestinal: Soft and nontender. No distention. No abdominal bruits. No CVA tenderness. Genitourinary:  Musculoskeletal: No lower extremity tenderness nor edema.  No joint effusions. Neurologic:  Normal speech and language. No gross focal neurologic deficits are appreciated. No facial droop  Skin:  Skin is warm, dry and intact. No rash noted. Psychiatric: Mood and affect are normal. Speech and behavior are normal.  ____________________________________________   LABS (all labs ordered are listed, but only abnormal results are displayed)  Results for orders placed or performed during the hospital encounter of 08/08/18 (from the past 24 hour(s))  Glucose, capillary     Status: Abnormal   Collection Time: 08/08/18  1:34 PM  Result Value Ref Range    Glucose-Capillary 111 (H) 70 - 99 mg/dL  Protime-INR     Status: None   Collection Time: 08/08/18  1:53 PM  Result Value Ref Range   Prothrombin Time 12.8 11.4 - 15.2 seconds   INR 0.97   APTT     Status: Abnormal   Collection Time: 08/08/18  1:53 PM  Result Value Ref Range   aPTT 56 (H) 24 - 36 seconds  CBC     Status: Abnormal   Collection Time: 08/08/18  1:53 PM  Result Value Ref Range   WBC 5.4 4.0 - 10.5 K/uL   RBC 5.28 4.22 - 5.81 MIL/uL   Hemoglobin 17.5 (H) 13.0 - 17.0 g/dL   HCT 16.1 09.6 - 04.5 %   MCV 91.9 80.0 - 100.0 fL   MCH 33.1 26.0 - 34.0 pg   MCHC 36.1 (H) 30.0 - 36.0 g/dL   RDW 40.9 81.1 - 91.4 %   Platelets 180 150 - 400 K/uL   nRBC 0.0 0.0 - 0.2 %  Differential     Status: None   Collection Time: 08/08/18  1:53 PM  Result Value Ref Range   Neutrophils Relative % 45 %   Neutro Abs 2.4 1.7 - 7.7 K/uL   Lymphocytes Relative 40 %   Lymphs Abs 2.1 0.7 - 4.0 K/uL   Monocytes Relative 9 %   Monocytes Absolute 0.5 0.1 - 1.0 K/uL   Eosinophils Relative 5 %   Eosinophils Absolute 0.3 0.0 - 0.5 K/uL   Basophils Relative 1 %   Basophils Absolute 0.1 0.0 - 0.1 K/uL   Immature Granulocytes 0 %   Abs Immature Granulocytes 0.02 0.00 - 0.07 K/uL  Comprehensive metabolic panel     Status: Abnormal   Collection Time: 08/08/18  1:53 PM  Result Value Ref Range   Sodium 139 135 - 145 mmol/L   Potassium 3.8 3.5 - 5.1 mmol/L   Chloride 103 98 - 111 mmol/L   CO2 26 22 - 32 mmol/L   Glucose, Bld 116 (H) 70 - 99 mg/dL   BUN 17 6 - 20 mg/dL   Creatinine, Ser 7.82 0.61 - 1.24 mg/dL   Calcium 9.9 8.9 - 95.6 mg/dL   Total Protein 7.7 6.5 - 8.1 g/dL   Albumin 4.9 3.5 - 5.0 g/dL   AST 35 15 - 41 U/L   ALT 54 (H) 0 - 44 U/L   Alkaline Phosphatase 61 38 - 126 U/L   Total Bilirubin 0.8 0.3 - 1.2 mg/dL   GFR calc non Af Amer >60 >60 mL/min   GFR calc Af Amer >60 >60 mL/min   Anion gap 10 5 - 15  Troponin I     Status: None   Collection Time: 08/08/18  1:53 PM  Result Value  Ref Range   Troponin I <0.03 <0.03 ng/mL   ____________________________________________  EKG My review and personal interpretation at Time: 13:36   Indication: htn  Rate: 110  Rhythm: sinus Axis: normal  Other: normal intervals, no stemi ____________________________________________  RADIOLOGY  I personally reviewed all radiographic images ordered to evaluate for the above acute complaints and reviewed radiology reports and findings.  These findings were personally discussed with the patient.  Please see medical record for radiology report.  ____________________________________________   PROCEDURES  Procedure(s) performed:  Procedures    Critical Care performed:  ____________________________________________   INITIAL IMPRESSION / ASSESSMENT AND PLAN / ED COURSE  Pertinent labs & imaging results that were available during my care of the patient were reviewed by me and considered in my medical decision making (see chart for details).   DDX: cva, tia, hypoglycemia, dehydration, electrolyte abnormality, dissection, sepsis   Fleet Higham is a 47 y.o. who presents to the ED with symptoms concerning for TIA.  Patient was medicated stroke out of triage given onset of symptoms but they are now improving.  Patient was evaluated immediately by neurology.  Not felt to be a TPA candidate.  Certainly concerning for hypertensive urgency.  EKG shows no evidence of A. fib.  CT head shows no evidence of bleed or abnormality.  MRI ordered to further evaluate.  Not clinically consistent with dissection.  The patient will be placed on continuous pulse oximetry and telemetry for monitoring.  Laboratory evaluation will be sent to evaluate for the above complaints.    Clinical Course as of Aug 08 1525  Fri Aug 08, 2018  1524 Blood work is reassuring thus far.  No evidence of ACS.  Do suspect hypertensive urgency and will require hospitalization for TIA work-up.   [PR]    Clinical Course User  Index [PR] Willy Eddy, MD     As part of my medical decision making, I reviewed the following data within the electronic MEDICAL RECORD NUMBER Nursing notes reviewed and incorporated, Labs reviewed, notes from prior ED visits and [place] Controlled Substance Database   ____________________________________________   FINAL CLINICAL IMPRESSION(S) / ED DIAGNOSES  Final diagnoses:  TIA (transient ischemic attack)  Hypertensive urgency      NEW MEDICATIONS STARTED DURING THIS VISIT:  New Prescriptions   No medications on file     Note:  This document was prepared using Dragon voice recognition software and may include unintentional dictation errors.    Willy Eddy, MD 08/08/18 [phone]

## 2018-08-08 NOTE — Progress Notes (Signed)
Vital signs obtained, heart monitor applied. Report given to oncoming nurse, Selena Batten.

## 2018-08-08 NOTE — H&P (Signed)
[facility] [place] at [facility]   PATIENT NAME: Jeffery Banks    MR#:  [id number]  DATE OF BIRTH:  03/14/1971  DATE OF ADMISSION:  08/08/2018  PRIMARY CARE PHYSICIAN: Eustaquio Boyden, MD   REQUESTING/REFERRING PHYSICIAN:   CHIEF COMPLAINT:  No chief complaint on file.   HISTORY OF PRESENT ILLNESS: Jeffery Banks  is a 47 y.o. male with a known history per below presenting with right arm numbness/tingling started around 12 and 30 today, in the emergency room patient was also found to be tachycardic and diaphoretic, code CVA was initiated in the emergency room, CT head/MRI of the brain was negative for any acute process, noted blood pressure 240/150 initially, currently much improved 140/113, patient evaluated in the emergency room, patient denies any numbness/tingling/weakness/visual changes, patient is now been admitted for a resolving transient ischemic attack.  PAST MEDICAL HISTORY:   Past Medical History:  Diagnosis Date  . Alcohol abuse, in remission college  . Generalized headaches    daily  . GERD (gastroesophageal reflux disease)   . HTN (hypertension)   . Hx of migraines    as child  . Obesity    borderline    PAST SURGICAL HISTORY:  Past Surgical History:  Procedure Laterality Date  . APPENDECTOMY  1982    SOCIAL HISTORY:  Social History   Tobacco Use  . Smoking status: Former Smoker    Last attempt to quit: 10/22/2010    Years since quitting: 7.8  . Smokeless tobacco: Never Used  Substance Use Topics  . Alcohol use: No    FAMILY HISTORY:  Family History  Problem Relation Age of Onset  . Hypertension Mother   . Cancer Maternal Aunt        beast  . Hypertension Maternal Grandfather   . Coronary artery disease Maternal Grandfather 60  . Hypertension Paternal Grandfather   . Stroke Neg Hx   . Diabetes Neg Hx   . CAD Other        paternal side    DRUG ALLERGIES: No Known Allergies  REVIEW OF SYSTEMS:   CONSTITUTIONAL: No fever,  fatigue or weakness.  EYES: No blurred or double vision.  EARS, NOSE, AND THROAT: No tinnitus or ear pain.  RESPIRATORY: No cough, shortness of breath, wheezing or hemoptysis.  CARDIOVASCULAR: No chest pain, orthopnea, edema.  GASTROINTESTINAL: No nausea, vomiting, diarrhea or abdominal pain.  GENITOURINARY: No dysuria, hematuria.  ENDOCRINE: No polyuria, nocturia,  HEMATOLOGY: No anemia, easy bruising or bleeding SKIN: No rash or lesion. MUSCULOSKELETAL: No joint pain or arthritis.   NEUROLOGIC: + tingling, numbness PSYCHIATRY: No anxiety or depression.   MEDICATIONS AT HOME:  Prior to Admission medications   Medication Sig Start Date End Date Taking? Authorizing Provider  lisinopril-hydrochlorothiazide (PRINZIDE,ZESTORETIC) 10-12.5 MG tablet TAKE 1 TABLET BY MOUTH EVERY DAY 06/30/18  Yes Eustaquio Boyden, MD      PHYSICAL EXAMINATION:   VITAL SIGNS: Blood pressure (!) 134/97, pulse (!) 105, SpO2 97 %.  GENERAL:  47 y.o.-year-old patient lying in the bed with no acute distress.  EYES: Pupils equal, round, reactive to light and accommodation. No scleral icterus. Extraocular muscles intact.  HEENT: Head atraumatic, normocephalic. Oropharynx and nasopharynx clear.  NECK:  Supple, no jugular venous distention. No thyroid enlargement, no tenderness.  LUNGS: Normal breath sounds bilaterally, no wheezing, rales,rhonchi or crepitation. No use of accessory muscles of respiration.  CARDIOVASCULAR: S1, S2 normal. No murmurs, rubs, or gallops.  ABDOMEN: Soft, nontender, nondistended. Bowel sounds present.  No organomegaly or mass.  EXTREMITIES: No pedal edema, cyanosis, or clubbing.  NEUROLOGIC: Cranial nerves II through XII are intact. Muscle strength 5/5 in all extremities. Sensation intact. Gait not checked.  PSYCHIATRIC: The patient is alert and oriented x 3.  SKIN: No obvious rash, lesion, or ulcer.   LABORATORY PANEL:   CBC Recent Labs  Lab 08/08/18 1353  WBC 5.4  HGB 17.5*  HCT  48.5  PLT 180  MCV 91.9  MCH 33.1  MCHC 36.1*  RDW 12.9  LYMPHSABS 2.1  MONOABS 0.5  EOSABS 0.3  BASOSABS 0.1   ------------------------------------------------------------------------------------------------------------------  Chemistries  Recent Labs  Lab 08/08/18 1353  NA 139  K 3.8  CL 103  CO2 26  GLUCOSE 116*  BUN 17  CREATININE 1.13  CALCIUM 9.9  AST 35  ALT 54*  ALKPHOS 61  BILITOT 0.8   ------------------------------------------------------------------------------------------------------------------ CrCl cannot be calculated (Unknown ideal weight.). ------------------------------------------------------------------------------------------------------------------ No results for input(s): TSH, T4TOTAL, T3FREE, THYROIDAB in the last 72 hours.  Invalid input(s): FREET3   Coagulation profile Recent Labs  Lab 08/08/18 1353  INR 0.97   ------------------------------------------------------------------------------------------------------------------- No results for input(s): DDIMER in the last 72 hours. -------------------------------------------------------------------------------------------------------------------  Cardiac Enzymes Recent Labs  Lab 08/08/18 1353  TROPONINI <0.03   ------------------------------------------------------------------------------------------------------------------ Invalid input(s): POCBNP  ---------------------------------------------------------------------------------------------------------------  Urinalysis No results found for: COLORURINE, APPEARANCEUR, LABSPEC, PHURINE, GLUCOSEU, HGBUR, BILIRUBINUR, KETONESUR, PROTEINUR, UROBILINOGEN, NITRITE, LEUKOCYTESUR   RADIOLOGY: Mr Brain Wo Contrast  Result Date: 08/08/2018 CLINICAL DATA:  Right arm numbness over the last hour. EXAM: MRI HEAD WITHOUT CONTRAST TECHNIQUE: Multiplanar, multiecho pulse sequences of the brain and surrounding structures were obtained without  intravenous contrast. COMPARISON:  CT same day FINDINGS: Brain: The brain has a normal appearance without evidence of malformation, atrophy, old or acute small or large vessel infarction, mass lesion, hemorrhage, hydrocephalus or extra-axial collection. Vascular: Major vessels at the base of the brain show flow. Venous sinuses appear patent. Skull and upper cervical spine: Normal. Sinuses/Orbits: Clear/normal. Other: None significant. IMPRESSION: Normal examination. Electronically Signed   By: Paulina Fusi M.D.   On: 08/08/2018 15:42   Ct Head Code Stroke Wo Contrast  Result Date: 08/08/2018 CLINICAL DATA:  Code stroke. Right upper and lower extremity weakness and numbness. EXAM: CT HEAD WITHOUT CONTRAST TECHNIQUE: Contiguous axial images were obtained from the base of the skull through the vertex without intravenous contrast. COMPARISON:  None. FINDINGS: Brain: There is no evidence of acute infarct, intracranial hemorrhage, mass, midline shift, or extra-axial fluid collection. The ventricles and sulci are normal. A cavum septum pellucidum is incidentally noted. Vascular: No hyperdense vessel. Skull: No fracture or focal osseous lesion. Sinuses/Orbits: Mild bilateral ethmoid and frontal sinus mucosal thickening. Clear mastoid air cells. Unremarkable orbits. Other: None. ASPECTS [facility] Stroke Program Early CT Score) - Ganglionic level infarction (caudate, lentiform nuclei, internal capsule, insula, M1-M3 cortex): 7 - Supraganglionic infarction (M4-M6 cortex): 3 Total score (0-10 with 10 being normal): 10 IMPRESSION: 1. Unremarkable CT appearance of the brain. 2. ASPECTS is 10. These results were called by telephone at the time of interpretation on 08/08/2018 at 1:51 pm to Dr. Willy Eddy , who verbally acknowledged these results. Electronically Signed   By: Sebastian Ache M.D.   On: 08/08/2018 13:51    EKG: Orders placed or performed during the hospital encounter of 08/08/18  . EKG 12-Lead  . EKG  12-Lead  . ED EKG  . ED EKG    IMPRESSION AND PLAN: *Acute resolved TIA *Acute accelerated hypertension *Chronic  GERD *History of alcohol abuse *Sinus tachycardia  Admit to regular nursing for bed on our CVA/TIA protocol, aspirin, check lipids in the morning, neurology to see, physical therapy/speech therapy to evaluate, IV fluids for rehydration, [place] protocol while in house, and continue close medical monitoring    All the records are reviewed and case discussed with ED provider. Management plans discussed with the patient, family and they are in agreement.  CODE STATUS:full    TOTAL TIME TAKING CARE OF THIS PATIENT: 45 minutes.    Evelena Asa Carri Spillers M.D on 08/08/2018   Between 7am to 6pm - Pager - [phone]  After 6pm go to [email or web address] - password [email or web address]  [facility]  Office  [phone]  CC: Primary care physician; Eustaquio Boyden, MD   Note: This dictation was prepared with Dragon dictation along with smaller phrase technology. Any transcriptional errors that result from this process are unintentional.

## 2018-08-08 NOTE — ED Notes (Signed)
Pt transported to floor on monitor, bedside report to [place], [place]

## 2018-08-08 NOTE — Consult Note (Signed)
Referring Physician: Lyndon Code    Chief Complaint: right arm numbness  HPI: Jeffery Banks is an 47 y.o. male with significant history of hypertension, alcohol abuse, migraine and headaches, presenting to the ED with chief complaints of right arm numbness. Patient state that he was at work when symptoms started at about 1215 pm. He checked his blood pressure at work and noticed that is was extremely elevated reading 240 systolic. He denied other associated symptoms of dizziness, headache, speech and gait difficulty, nausea/vomiting, or other focal neurological deficit. He has history of hypertension on antihypertensive which he reports taking consistently. He admits to smoking 1 pack of cigarette occassionally for the past 15 years. Initial NIH stroke scale 1. He had CT head which was negative with no acute intracranial abnormality. Due to his stroke risk factors he was admitted for further stroke work up.  Date last known well: Date: 08/08/2018 Time last known well: Time: 12:15 tPA Given: No: NIH stroke scale of 1 with resolving symptoms.  Past Medical History:  Diagnosis Date  . Alcohol abuse, in remission college  . Generalized headaches    daily  . GERD (gastroesophageal reflux disease)   . HTN (hypertension)   . Hx of migraines    as child  . Obesity    borderline    Past Surgical History:  Procedure Laterality Date  . APPENDECTOMY  1982    Family History  Problem Relation Age of Onset  . Hypertension Mother   . Cancer Maternal Aunt        beast  . Hypertension Maternal Grandfather   . Coronary artery disease Maternal Grandfather 60  . Hypertension Paternal Grandfather   . Stroke Neg Hx   . Diabetes Neg Hx   . CAD Other        paternal side   Social History:  reports that he quit smoking about 7 years ago. He has never used smokeless tobacco. He reports that he does not drink alcohol or use drugs.  Allergies: No Known Allergies  Medications:  I have reviewed  the patient's current medications.  Prior to Admission medications   Medication Sig Start Date End Date Taking? Authorizing Provider  lisinopril-hydrochlorothiazide (PRINZIDE,ZESTORETIC) 10-12.5 MG tablet TAKE 1 TABLET BY MOUTH EVERY DAY 06/30/18  Yes Eustaquio Boyden, MD    ROS: History obtained from the patient   General ROS: negative for - chills, fatigue, fever, night sweats, weight gain or weight loss Psychological ROS: negative for - behavioral disorder, hallucinations, memory difficulties, mood swings or suicidal ideation Ophthalmic ROS: negative for - blurry vision, double vision, eye pain or loss of vision ENT ROS: negative for - epistaxis, nasal discharge, oral lesions, sore throat, tinnitus or vertigo Allergy and Immunology ROS: negative for - hives or itchy/watery eyes Hematological and Lymphatic ROS: negative for - bleeding problems, bruising or swollen lymph nodes Endocrine ROS: negative for - galactorrhea, hair pattern changes, polydipsia/polyuria or temperature intolerance Respiratory ROS: negative for - cough, hemoptysis, shortness of breath or wheezing Cardiovascular ROS: negative for - chest pain, dyspnea on exertion, edema or irregular heartbeat Gastrointestinal ROS: negative for - abdominal pain, diarrhea, hematemesis, nausea/vomiting or stool incontinence Genito-Urinary ROS: negative for - dysuria, hematuria, incontinence or urinary frequency/urgency Musculoskeletal ROS: negative for - joint swelling or muscular weakness Neurological ROS: as noted in HPI Dermatological ROS: negative for rash and skin lesion changes  Physical Examination: Blood pressure (!) 145/96, pulse (!) 111, SpO2 97 %.   HEENT-  Normocephalic, no lesions, without  obvious abnormality.  Normal external eye and conjunctiva.  Normal TM's bilaterally.  Normal auditory canals and external ears. Normal external nose, mucus membranes and septum.  Normal pharynx. Cardiovascular- S1, S2 normal, pulses  palpable throughout   Lungs- chest clear, no wheezing, rales, normal symmetric air entry Abdomen- soft, non-tender; bowel sounds normal; no masses,  no organomegaly Extremities- no edema Lymph-no adenopathy palpable Musculoskeletal-no joint tenderness, deformity or swelling Skin-warm and dry, no hyperpigmentation, vitiligo, or suspicious lesions  Neurological Exam   Mental Status: Alert, oriented, thought content appropriate.  Speech fluent without evidence of aphasia.  Able to follow 3 step commands without difficulty. Attention span and concentration seemed appropriate  Cranial Nerves: II: Discs flat bilaterally; Visual fields grossly normal, pupils equal, round, reactive to light and accommodation III,IV, VI: ptosis not present, extra-ocular motions intact bilaterally V,VII: smile symmetric, facial light touch sensation intact VIII: hearing normal bilaterally IX,X: gag reflex present XI: bilateral shoulder shrug XII: midline tongue extension Motor: Right :  Upper extremity   5/5 Without pronator drift      Left: Upper extremity   5/5 without pronator drift Right:   Lower extremity   5/5                                          Left: Lower extremity   5/5 Tone and bulk:normal tone throughout; no atrophy noted Sensory: Pinprick and light touch intact bilaterally Deep Tendon Reflexes: 2+ and symmetric throughout Plantars: Right: mute                              Left: mute Cerebellar: Finger-to-nose testing intact bilaterally. Heel to shin testing normal bilaterally Gait: not tested due to safety concerns  Data Reviewed  Laboratory Studies:  Basic Metabolic Panel: Recent Labs  Lab 08/08/18 1353  NA 139  K 3.8  CL 103  CO2 26  GLUCOSE 116*  BUN 17  CREATININE 1.13  CALCIUM 9.9    Liver Function Tests: Recent Labs  Lab 08/08/18 1353  AST 35  ALT 54*  ALKPHOS 61  BILITOT 0.8  PROT 7.7  ALBUMIN 4.9   No results for input(s): LIPASE, AMYLASE in the last 168  hours. No results for input(s): AMMONIA in the last 168 hours.  CBC: Recent Labs  Lab 08/08/18 1353  WBC 5.4  NEUTROABS 2.4  HGB 17.5*  HCT 48.5  MCV 91.9  PLT 180    Cardiac Enzymes: Recent Labs  Lab 08/08/18 1353  TROPONINI <0.03    BNP: Invalid input(s): POCBNP  CBG: Recent Labs  Lab 08/08/18 1334  GLUCAP 111*    Microbiology: No results found for this or any previous visit.  Coagulation Studies: Recent Labs    08/08/18 1353  LABPROT 12.8  INR 0.97    Urinalysis: No results for input(s): COLORURINE, LABSPEC, PHURINE, GLUCOSEU, HGBUR, BILIRUBINUR, KETONESUR, PROTEINUR, UROBILINOGEN, NITRITE, LEUKOCYTESUR in the last 168 hours.  Invalid input(s): APPERANCEUR  Lipid Panel:    Component Value Date/Time   CHOL 203 (H) 04/15/2017 1114   TRIG 131.0 04/15/2017 1114   HDL 43.30 04/15/2017 1114   CHOLHDL 5 04/15/2017 1114   VLDL 26.2 04/15/2017 1114   LDLCALC 133 (H) 04/15/2017 1114    HgbA1C: No results found for: HGBA1C  Urine Drug Screen:  No results found for: LABOPIA, COCAINSCRNUR, LABBENZ, AMPHETMU,  THCU, LABBARB  Alcohol Level: No results for input(s): ETH in the last 168 hours.  Other results: EKG: sinus tachycardia. Vent. rate 108 BPM PR interval 156 ms QRS duration 80 ms QT/QTc 312/418 ms P-R-T axes 35 43 22  Imaging: Ct Head Code Stroke Wo Contrast  Result Date: 08/08/2018 CLINICAL DATA:  Code stroke. Right upper and lower extremity weakness and numbness. EXAM: CT HEAD WITHOUT CONTRAST TECHNIQUE: Contiguous axial images were obtained from the base of the skull through the vertex without intravenous contrast. COMPARISON:  None. FINDINGS: Brain: There is no evidence of acute infarct, intracranial hemorrhage, mass, midline shift, or extra-axial fluid collection. The ventricles and sulci are normal. A cavum septum pellucidum is incidentally noted. Vascular: No hyperdense vessel. Skull: No fracture or focal osseous lesion. Sinuses/Orbits: Mild  bilateral ethmoid and frontal sinus mucosal thickening. Clear mastoid air cells. Unremarkable orbits. Other: None. ASPECTS [facility] Stroke Program Early CT Score) - Ganglionic level infarction (caudate, lentiform nuclei, internal capsule, insula, M1-M3 cortex): 7 - Supraganglionic infarction (M4-M6 cortex): 3 Total score (0-10 with 10 being normal): 10 IMPRESSION: 1. Unremarkable CT appearance of the brain. 2. ASPECTS is 10. These results were called by telephone at the time of interpretation on 08/08/2018 at 1:51 pm to Dr. Willy Eddy , who verbally acknowledged these results. Electronically Signed   By: Sebastian Ache M.D.   On: 08/08/2018 13:51    Patient seen and examined.  Clinical course and management discussed.  Necessary edits performed.  I agree with the above.  Assessment and plan of care developed and discussed below.     Assessment: 47 y.o. male with significant history of hypertension, alcohol abuse, migraine and headaches, presenting with chief complaints of right arm numbness.  BP elevated.  Symptoms resolved.  Head CT reviewed and shows no acute changes.  Questionable TIA.  Patient on no antiplatelet therapy prior to presentation.  Stroke Risk Factors - family history, hypertension and smoking  Plan: 1. HgbA1c, fasting lipid panel 2. MRI of the brain without contrast 3. PT consult, OT consult, Speech consult 4. Echocardiogram 5. Carotid dopplers 6. Prophylactic therapy-Antiplatelet med: Aspirin - dose 81 mg/day 7. NPO until RN stroke swallow screen 8. Telemetry monitoring 9. Frequent neuro checks 10. Smoking cessation counseling 11. BP control  This patient was staffed with Dr. Verlon Au, Thad Ranger who personally evaluated patient, reviewed documentation and agreed with assessment and plan of care as above.  Webb Silversmith, DNP, FNP-BC Board certified Nurse Practitioner Neurology Department  08/08/2018, 2:41 PM  Thana Farr, MD Neurology [phone]  08/08/2018   5:05 PM

## 2018-08-08 NOTE — Progress Notes (Signed)
Family Meeting Note  Advance Directive:yes  Today a meeting took place with the Patient.  Patient is able to participate   The following clinical team members were present during this meeting:MD  The following were discussed:Patient's diagnosis:TIA , Patient's progosis: Unable to determine and Goals for treatment: Full Code  Additional follow-up to be provided: prn  Time spent during discussion:20 minutes  Bertrum Sol, MD

## 2018-08-08 NOTE — ED Notes (Signed)
Pt to MRI

## 2018-08-08 NOTE — Progress Notes (Signed)
Chaplain responded to a a code stroke. Pt had no family present and none on the way. Chaplain practiced pastoral presence for staff and Pt. Chaplain prayed for the patient. Chaplain departed to return to previous patient.    08/08/18 1400  Clinical Encounter Type  Visited With Patient  Visit Type Code  Referral From Nurse  Spiritual Encounters  Spiritual Needs Prayer

## 2018-08-08 NOTE — ED Notes (Signed)
CODE STROKE CALLED TO 333 

## 2018-08-08 NOTE — Code Documentation (Signed)
Pt states sudden right arm numbness that began at 1215 while at work, states hx of HTN, pt states he checked his BP at work and noticed it was elevated, upon arrival to triage RN noted pt to be tachycardiac and diaphoretic, CBG 111, code stroke activated upon arrival to ED, pt taken to CT for non con head CT and then to room 26, initial NIHSS 0, pt states resolving symptoms, no tPA given, report off to [employer]

## 2018-08-09 ENCOUNTER — Observation Stay
Admit: 2018-08-09 | Discharge: 2018-08-09 | Disposition: A | Discharge status: Home / Self Care | Payer: BLUE CROSS/BLUE SHIELD | Attending: Family Medicine | Admitting: Family Medicine

## 2018-08-09 ENCOUNTER — Encounter: Payer: Self-pay | Admitting: Radiology

## 2018-08-09 ENCOUNTER — Observation Stay: Payer: BLUE CROSS/BLUE SHIELD

## 2018-08-09 ENCOUNTER — Other Ambulatory Visit: Payer: Self-pay

## 2018-08-09 LAB — LIPID PANEL
CHOL/HDL RATIO: 6.8 ratio
Cholesterol: 238 mg/dL — ABNORMAL HIGH (ref 0–200)
HDL: 35 mg/dL — ABNORMAL LOW (ref >40)
LDL CALC: 149 mg/dL — AB (ref 0–99)
TRIGLYCERIDES: 270 mg/dL — AB (ref <150)
VLDL: 54 mg/dL — ABNORMAL HIGH (ref 0–40)

## 2018-08-09 LAB — HEMOGLOBIN A1C
Hgb A1c MFr Bld: 5.4 % (ref 4.8–5.6)
Mean Plasma Glucose: 108.28 mg/dL

## 2018-08-09 LAB — ECHOCARDIOGRAM COMPLETE
Height: 75 in
WEIGHTICAEL: 4010.61 [oz_av]

## 2018-08-09 MED ORDER — ATORVASTATIN CALCIUM 20 MG PO TABS: 40.0000 mg | ORAL_TABLET | Freq: Every day | ORAL | Status: DC

## 2018-08-09 MED ORDER — ATORVASTATIN CALCIUM 40 MG PO TABS: 40.0000 mg | ORAL_TABLET | Freq: Every day | ORAL | 2 refills | Status: DC

## 2018-08-09 MED ORDER — ASPIRIN EC 81 MG PO TBEC: 81.0000 mg | DELAYED_RELEASE_TABLET | Freq: Every day | ORAL | 2 refills | Status: AC

## 2018-08-09 NOTE — Progress Notes (Signed)
Discharged patient home with self, patient verbalized understanding of education. Stroke handout given, patient with no complaints.

## 2018-08-09 NOTE — Discharge Instructions (Signed)
Diet control and exercise. °

## 2018-08-09 NOTE — Progress Notes (Signed)
*  PRELIMINARY RESULTS* Echocardiogram 2D Echocardiogram has been performed.  Lindsey Demonte S Tyliek Timberman 08/09/2018, 11:09 AM 

## 2018-08-09 NOTE — Evaluation (Signed)
Physical Therapy Evaluation Patient Details Name: Jeffery Banks MRN: [id number] DOB: Oct 11, 1971 Today's Date: 08/09/2018   History of Present Illness  pt is a 47 y.o M admitted on 08/08/2018 with dx of TIA. He was transported to ED with CC of RUE weakness.   Clinical Impression  Pt currently laying in bed with PT entered the room. He is A&O x 4 and currently reports pain in the elbow at the IV site nrated at 1-2/10. He is independent with bed mobility and transfers. He demonstrated functional UE/ LE strength and exhibits no visual field deficits. He ambulated ~300 ft with no AD with supervision performing with head turns and talking with no indications of sway/ compensation or LOB. Based on pt's age, performance on evaluation and report that he does exercise it appears he has returned to baseline and does not require skilled physical therapy at this time.    Follow Up Recommendations No PT follow up    Equipment Recommendations  None recommended by PT    Recommendations for Other Services       Precautions / Restrictions Precautions Precautions: None Restrictions Weight Bearing Restrictions: No      Mobility  Bed Mobility Overal bed mobility: Independent                Transfers Overall transfer level: Independent                  Ambulation/Gait Ambulation/Gait assistance: Supervision Gait Distance (Feet): 300 Feet Assistive device: None Gait Pattern/deviations: WFL(Within Functional Limits)     General Gait Details: was able to perform head turns while talking simultaneously maintaining balance and navigating around obstacles sway or LOB  Stairs            Wheelchair Mobility    Modified Rankin (Stroke Patients Only)       Balance Overall balance assessment: Independent                                           Pertinent Vitals/Pain Pain Assessment: 0-10 Pain Score: 2  Pain Location: L elbow at IV site Pain  Descriptors / Indicators: Sore Pain Intervention(s): Monitored during session    Home Living Family/patient expects to be discharged to:: Private residence Living Arrangements: Alone(children intermittenlty due to joint custody)   Type of Home: House(townhome) Home Access: Level entry     Home Layout: Two level Home Equipment: None      Prior Function Level of Independence: Independent               Hand Dominance   Dominant Hand: Right    Extremity/Trunk Assessment   Upper Extremity Assessment Upper Extremity Assessment: Overall WFL for tasks assessed    Lower Extremity Assessment Lower Extremity Assessment: Overall WFL for tasks assessed       Communication   Communication: No difficulties  Cognition Arousal/Alertness: Awake/alert Behavior During Therapy: WFL for tasks assessed/performed Overall Cognitive Status: Within Functional Limits for tasks assessed                                        General Comments      Exercises     Assessment/Plan    PT Assessment Patent does not need any further PT services  PT Problem List  PT Treatment Interventions      PT Goals (Current goals can be found in the Care Plan section)  Acute Rehab PT Goals Patient Stated Goal: to go home PT Goal Formulation: With patient    Frequency     Barriers to discharge        Co-evaluation               AM-PAC PT "6 Clicks" Daily Activity  Outcome Measure Difficulty turning over in bed (including adjusting bedclothes, sheets and blankets)?: None Difficulty moving from lying on back to sitting on the side of the bed? : None Difficulty sitting down on and standing up from a chair with arms (e.g., wheelchair, bedside commode, etc,.)?: None Help needed moving to and from a bed to chair (including a wheelchair)?: None Help needed walking in hospital room?: None Help needed climbing 3-5 steps with a railing? : None 6 Click Score: 24     End of Session Equipment Utilized During Treatment: Gait belt Activity Tolerance: Patient tolerated treatment well Patient left: in bed;with call bell/phone within reach Nurse Communication: Mobility status;Other (comment)(how session went)      Time: 2841-3244 PT Time Calculation (min) (ACUTE ONLY): 25 min   Charges:   PT Evaluation $PT Eval Low Complexity: 1 Low PT Treatments $Gait Training: 8-22 mins        Zymir Napoli PT, DPT, LAT, ATC  08/09/18  9:12 AM       Gracious Renken 08/09/2018, 9:06 AM

## 2018-08-09 NOTE — Plan of Care (Signed)
  Problem: Education: Goal: Knowledge of General Education information will improve Description Including pain rating scale, medication(s)/side effects and non-pharmacologic comfort measures Outcome: Progressing   Problem: Health Behavior/Discharge Planning: Goal: Ability to manage health-related needs will improve Outcome: Progressing   Problem: Clinical Measurements: Goal: Ability to maintain clinical measurements within normal limits will improve Outcome: Progressing Goal: Will remain free from infection Outcome: Progressing Goal: Diagnostic test results will improve Outcome: Progressing Goal: Respiratory complications will improve Outcome: Progressing Goal: Cardiovascular complication will be avoided Outcome: Progressing   Problem: Activity: Goal: Risk for activity intolerance will decrease Outcome: Progressing   Problem: Nutrition: Goal: Adequate nutrition will be maintained Outcome: Progressing   Problem: Coping: Goal: Level of anxiety will decrease Outcome: Progressing   Problem: Elimination: Goal: Will not experience complications related to bowel motility Outcome: Progressing Goal: Will not experience complications related to urinary retention Outcome: Progressing   Problem: Pain Managment: Goal: General experience of comfort will improve Outcome: Progressing   Problem: Safety: Goal: Ability to remain free from injury will improve Outcome: Progressing   Problem: Skin Integrity: Goal: Risk for impaired skin integrity will decrease Outcome: Progressing   Problem: Education: Goal: Knowledge of secondary prevention will improve Outcome: Progressing Goal: Knowledge of patient specific risk factors addressed and post discharge goals established will improve Outcome: Progressing Goal: Individualized Educational Video(s) Outcome: Progressing   Problem: Self-Care: Goal: Verbalization of feelings and concerns over difficulty with self-care will  improve Outcome: Progressing Goal: Ability to communicate needs accurately will improve Outcome: Progressing

## 2018-08-09 NOTE — Progress Notes (Signed)
SLP Cancellation Note  Patient Details Name: Jeffery Banks MRN: [id number] DOB: Jun 17, 1971   Cancelled treatment:       Reason Eval/Treat Not Completed: SLP screened, no needs identified, will sign off(chart reviewed; consulted NSG then met w/ pt). Pt denied any difficulty swallowing and is currently on a regular diet; tolerates swallowing pills w/ water per NSG. Pt conversed at conversational level w/out deficits noted; pt denied any speech-language deficits. Denied any difficulty texting and communicating via phone. No further skilled ST services indicated as pt appears at his baseline. Pt agreed. NSG to reconsult if any change in status.     Orinda Kenner, MS, CCC-SLP Watson,Katherine 08/09/2018, 9:03 AM

## 2018-08-09 NOTE — Progress Notes (Signed)
OT Screen Note  Patient Details Name: Jeffery Banks MRN: [id number] DOB: 1971-09-19   Cancelled Treatment:    Reason Eval/Treat Not Completed: OT screened, no needs identified, will sign off. Order received, chart reviewed. Pt back to baseline functional independence. No skilled OT needs identified. Will sign off. Please re-consult if additional needs arise.   Richrd Prime, MPH, MS, OTR/L ascom [phone] 08/09/18, 9:29 AM

## 2018-08-09 NOTE — Discharge Summary (Signed)
[facility]   PATIENT NAME: Jeffery Banks    MR#:  [id number]  DATE OF BIRTH:  [birth year]  DATE OF ADMISSION:  08/08/2018   ADMITTING PHYSICIAN: Bertrum Sol, MD  DATE OF DISCHARGE: 08/09/2018 PRIMARY CARE PHYSICIAN: Eustaquio Boyden, MD   ADMISSION DIAGNOSIS:  TIA (transient ischemic attack) [G45.9] Hypertensive urgency [I16.0] DISCHARGE DIAGNOSIS:  Active Problems:   TIA (transient ischemic attack)  SECONDARY DIAGNOSIS:   Past Medical History:  Diagnosis Date  . Alcohol abuse, in remission college  . Generalized headaches    daily  . GERD (gastroesophageal reflux disease)   . HTN (hypertension)   . Hx of migraines    as child  . Obesity    borderline   HOSPITAL COURSE:  *Acute resolved TIA MRI of the brain is negative for CVA.  The patient was treated with aspirin.  Continue aspirin 81 mg p.o. Daily. Carotid duplex is unremarkable.  Echo is pending.  Follow-up echo with PCP.  Hyperlipidemia.  Start Lipitor 40 mg at bedtime.  The patient was advised for exercise and diet control.  *Acute accelerated hypertension, resolved.  Continue home lisinopril-HCTZ.  *Chronic GERD *History of alcohol abuse. *Sinus tachycardia.  Improved. Obesity.  Diet control and exercise. DISCHARGE CONDITIONS:  Stable, discharge to home today. CONSULTS OBTAINED:  Treatment Team:  Kym Groom, MD Pauletta Browns, MD DRUG ALLERGIES:  No Known Allergies DISCHARGE MEDICATIONS:   Allergies as of 08/09/2018   No Known Allergies     Medication List    TAKE these medications   aspirin EC 81 MG tablet Take 1 tablet (81 mg total) by mouth daily.   atorvastatin 40 MG tablet Commonly known as:  LIPITOR Take 1 tablet (40 mg total) by mouth daily at 6 PM.   lisinopril-hydrochlorothiazide 10-12.5 MG tablet Commonly known as:  PRINZIDE,ZESTORETIC TAKE 1 TABLET BY MOUTH EVERY DAY        DISCHARGE INSTRUCTIONS:  See AVS.  If  you experience worsening of your admission symptoms, develop shortness of breath, life threatening emergency, suicidal or homicidal thoughts you must seek medical attention immediately by calling 911 or calling your MD immediately  if symptoms less severe.  You Must read complete instructions/literature along with all the possible adverse reactions/side effects for all the Medicines you take and that have been prescribed to you. Take any new Medicines after you have completely understood and accpet all the possible adverse reactions/side effects.   Please note  You were cared for by a hospitalist during your hospital stay. If you have any questions about your discharge medications or the care you received while you were in the hospital after you are discharged, you can call the unit and asked to speak with the hospitalist on call if the hospitalist that took care of you is not available. Once you are discharged, your primary care physician will handle any further medical issues. Please note that NO REFILLS for any discharge medications will be authorized once you are discharged, as it is imperative that you return to your primary care physician (or establish a relationship with a primary care physician if you do not have one) for your aftercare needs so that they can reassess your need for medications and monitor your lab values.    On the day of Discharge:  VITAL SIGNS:  Blood pressure 122/81, pulse 66, temperature 98.2 F (36.8 C), temperature source Oral, resp. rate 20, height 6\' 3"  (1.905 m), weight 113.7 kg, SpO2  96 %. PHYSICAL EXAMINATION:  GENERAL:  47 y.o.-year-old patient lying in the bed with no acute distress.  EYES: Pupils equal, round, reactive to light and accommodation. No scleral icterus. Extraocular muscles intact.  HEENT: Head atraumatic, normocephalic. Oropharynx and nasopharynx clear.  NECK:  Supple, no jugular venous distention. No thyroid enlargement, no tenderness.  LUNGS:  Normal breath sounds bilaterally, no wheezing, rales,rhonchi or crepitation. No use of accessory muscles of respiration.  CARDIOVASCULAR: S1, S2 normal. No murmurs, rubs, or gallops.  ABDOMEN: Soft, non-tender, non-distended. Bowel sounds present. No organomegaly or mass.  EXTREMITIES: No pedal edema, cyanosis, or clubbing.  NEUROLOGIC: Cranial nerves II through XII are intact. Muscle strength 5/5 in all extremities. Sensation intact. Gait not checked.  PSYCHIATRIC: The patient is alert and oriented x 3.  SKIN: No obvious rash, lesion, or ulcer.  DATA REVIEW:   CBC Recent Labs  Lab 08/08/18 1353  WBC 5.4  HGB 17.5*  HCT 48.5  PLT 180    Chemistries  Recent Labs  Lab 08/08/18 1353  NA 139  K 3.8  CL 103  CO2 26  GLUCOSE 116*  BUN 17  CREATININE 1.13  CALCIUM 9.9  AST 35  ALT 54*  ALKPHOS 61  BILITOT 0.8     Microbiology Results  No results found for this or any previous visit.  RADIOLOGY:  Mr Brain Wo Contrast  Result Date: 08/08/2018 CLINICAL DATA:  Right arm numbness over the last hour. EXAM: MRI HEAD WITHOUT CONTRAST TECHNIQUE: Multiplanar, multiecho pulse sequences of the brain and surrounding structures were obtained without intravenous contrast. COMPARISON:  CT same day FINDINGS: Brain: The brain has a normal appearance without evidence of malformation, atrophy, old or acute small or large vessel infarction, mass lesion, hemorrhage, hydrocephalus or extra-axial collection. Vascular: Major vessels at the base of the brain show flow. Venous sinuses appear patent. Skull and upper cervical spine: Normal. Sinuses/Orbits: Clear/normal. Other: None significant. IMPRESSION: Normal examination. Electronically Signed   By: Paulina Fusi M.D.   On: 08/08/2018 15:42   US Carotid Bilateral (at [facility] Only)  Result Date: 08/09/2018 CLINICAL DATA:  TIA with right arm numbness. EXAM: BILATERAL CAROTID DUPLEX ULTRASOUND TECHNIQUE: Wallace Cullens scale imaging, color Doppler and duplex  ultrasound were performed of bilateral carotid and vertebral arteries in the neck. COMPARISON:  None. FINDINGS: Criteria: Quantification of carotid stenosis is based on velocity parameters that correlate the residual internal carotid diameter with NASCET-based stenosis levels, using the diameter of the distal internal carotid lumen as the denominator for stenosis measurement. The following velocity measurements were obtained: RIGHT ICA: 82 cm/sec CCA: 95 cm/sec SYSTOLIC ICA/CCA RATIO:  0.9 ECA: 87 cm/sec LEFT ICA: 81 cm/sec CCA: 120 cm/sec SYSTOLIC ICA/CCA RATIO:  0.7 ECA: 90 cm/sec RIGHT CAROTID ARTERY: Small amount of echogenic plaque at the right carotid bulb. External carotid artery is patent with normal waveform. Normal waveforms and velocities in the internal carotid artery. RIGHT VERTEBRAL ARTERY: Antegrade flow and normal waveform in the right vertebral artery. LEFT CAROTID ARTERY: Left carotid arteries are patent without significant plaque or stenosis. External carotid artery is patent with normal waveform. Normal waveforms and velocities in the internal carotid artery. LEFT VERTEBRAL ARTERY: Antegrade flow and normal waveform in the left vertebral artery. IMPRESSION: No significant carotid artery stenosis. Small focus of plaque at the right carotid bulb. Patent vertebral arteries with antegrade flow. Electronically Signed   By: Richarda Overlie M.D.   On: 08/09/2018 11:05   Ct Head Code Stroke Wo  Contrast  Result Date: 08/08/2018 CLINICAL DATA:  Code stroke. Right upper and lower extremity weakness and numbness. EXAM: CT HEAD WITHOUT CONTRAST TECHNIQUE: Contiguous axial images were obtained from the base of the skull through the vertex without intravenous contrast. COMPARISON:  None. FINDINGS: Brain: There is no evidence of acute infarct, intracranial hemorrhage, mass, midline shift, or extra-axial fluid collection. The ventricles and sulci are normal. A cavum septum pellucidum is incidentally noted.  Vascular: No hyperdense vessel. Skull: No fracture or focal osseous lesion. Sinuses/Orbits: Mild bilateral ethmoid and frontal sinus mucosal thickening. Clear mastoid air cells. Unremarkable orbits. Other: None. ASPECTS [facility] Stroke Program Early CT Score) - Ganglionic level infarction (caudate, lentiform nuclei, internal capsule, insula, M1-M3 cortex): 7 - Supraganglionic infarction (M4-M6 cortex): 3 Total score (0-10 with 10 being normal): 10 IMPRESSION: 1. Unremarkable CT appearance of the brain. 2. ASPECTS is 10. These results were called by telephone at the time of interpretation on 08/08/2018 at 1:51 pm to Dr. Willy Eddy , who verbally acknowledged these results. Electronically Signed   By: Sebastian Ache M.D.   On: 08/08/2018 13:51     Management plans discussed with the patient, family and they are in agreement.  CODE STATUS: Full Code   TOTAL TIME TAKING CARE OF THIS PATIENT: 28 minutes.    Shaune Pollack M.D on 08/09/2018 at 1:08 PM  Between 7am to 6pm - Pager - [phone]  After 6pm go to [email or web address] - Social research officer, government  [facility]  Office  [phone]  CC: Primary care physician; Eustaquio Boyden, MD   Note: This dictation was prepared with Dragon dictation along with smaller phrase technology. Any transcriptional errors that result from this process are unintentional.

## 2018-08-12 LAB — HIV ANTIBODY (ROUTINE TESTING W REFLEX): HIV SCREEN 4TH GENERATION: NONREACTIVE

## 2018-08-14 ENCOUNTER — Encounter: Payer: Self-pay | Admitting: Family Medicine

## 2018-08-14 ENCOUNTER — Ambulatory Visit (INDEPENDENT_AMBULATORY_CARE_PROVIDER_SITE_OTHER): Payer: BLUE CROSS/BLUE SHIELD | Admitting: Family Medicine

## 2018-08-14 VITALS — BP 122/80 | HR 77 | Temp 98.2°F | Ht 74.5 in | Wt 253.5 lb

## 2018-08-14 DIAGNOSIS — R931 Abnormal findings on diagnostic imaging of heart and coronary circulation: Secondary | ICD-10-CM | POA: Insufficient documentation

## 2018-08-14 DIAGNOSIS — E785 Hyperlipidemia, unspecified: Secondary | ICD-10-CM

## 2018-08-14 DIAGNOSIS — G459 Transient cerebral ischemic attack, unspecified: Secondary | ICD-10-CM

## 2018-08-14 DIAGNOSIS — Z23 Encounter for immunization: Secondary | ICD-10-CM | POA: Diagnosis not present

## 2018-08-14 DIAGNOSIS — R0683 Snoring: Secondary | ICD-10-CM

## 2018-08-14 DIAGNOSIS — E669 Obesity, unspecified: Secondary | ICD-10-CM

## 2018-08-14 DIAGNOSIS — I10 Essential (primary) hypertension: Secondary | ICD-10-CM

## 2018-08-14 NOTE — Assessment & Plan Note (Signed)
Chronic. Now on atorvastatin. Tolerating well.  The 10-year ASCVD risk score Denman George DC Montez Hageman., et al., 2013) is: 4.5%   Values used to calculate the score:     Age: 47 years     Sex: Male     Is Non-Hispanic African American: No     Diabetic: No     Tobacco smoker: No     Systolic Blood Pressure: 122 mmHg     Is BP treated: Yes     HDL Cholesterol: 35 mg/dL     Total Cholesterol: 238 mg/dL

## 2018-08-14 NOTE — Patient Instructions (Addendum)
Flu shot today We will refer you to cardiology and sleep doctors for further evaluation. Continue medicines as up to now.

## 2018-08-14 NOTE — Assessment & Plan Note (Addendum)
Better control - continue current regimen, consider increased dose if needed.

## 2018-08-14 NOTE — Assessment & Plan Note (Signed)
Reviewed echo with patient - mild DD, EF 50%, diffuse hypokinesis and dilated RV - rec cards eval for abnormal echo and pulm eval for sleep apnea evaluation. Pt agrees with plan.

## 2018-08-14 NOTE — Progress Notes (Signed)
BP 122/80 (BP Location: Left Arm, Patient Position: Sitting, Cuff Size: Large)   Pulse 77   Temp 98.2 F (36.8 C) (Oral)   Ht 6' 2.5" (1.892 m)   Wt 253 lb 8 oz (115 kg)   SpO2 97%   BMI 32.11 kg/m   On recheck BP 124/92  CC: hosp f/u visit Subjective:    Patient ID: Jeffery Banks, male    DOB: 1971/07/20, 47 y.o.   MRN: [id number]  HPI: Jeffery Banks is a 47 y.o. male presenting on 08/14/2018 for Hospitalization Follow-up   Last seen 03/2017 for physical.  Recent hospitalization for hypertensive emergency with BP 240/150 and sudden onset R arm numbness/tingling. Records reviewed. CT and MRI head negative for CVA. Aspirin and lipitor 40mg  daily started. Antihypertensives continued (lisinopril hctz). Carotid duplex was reassuring. Echocardiogram showed mild LV dysfunction (EF 50%) with diffuse hypokinesis and mild diastolic dysfunction, mildly dilated RV, no cardiac source of emboli.   No significant stress on morning of symptoms. No missed doses.  Prior to this episode, had been monitoring BP at home intermittently 130-140/85-95.   Snores. Some daytime somnolence. No PNdyspnea. No witnessed apneic episodes.   DATE OF ADMISSION:  08/08/2018   DATE OF DISCHARGE: 08/09/2018 TCM hosp f/u phone call not performed.   D/C dx: TIA (transient ischemic attack)  Relevant past medical, surgical, family and social history reviewed and updated as indicated. Interim medical history since our last visit reviewed. Allergies and medications reviewed and updated. Outpatient Medications Prior to Visit  Medication Sig Dispense Refill  . aspirin EC 81 MG tablet Take 1 tablet (81 mg total) by mouth daily. 30 tablet 2  . atorvastatin (LIPITOR) 40 MG tablet Take 1 tablet (40 mg total) by mouth daily at 6 PM. 30 tablet 2  . lisinopril-hydrochlorothiazide (PRINZIDE,ZESTORETIC) 10-12.5 MG tablet TAKE 1 TABLET BY MOUTH EVERY DAY 90 tablet 0   No facility-administered medications prior to  visit.      Per HPI unless specifically indicated in ROS section below Review of Systems     Objective:    BP 122/80 (BP Location: Left Arm, Patient Position: Sitting, Cuff Size: Large)   Pulse 77   Temp 98.2 F (36.8 C) (Oral)   Ht 6' 2.5" (1.892 m)   Wt 253 lb 8 oz (115 kg)   SpO2 97%   BMI 32.11 kg/m   Wt Readings from Last 3 Encounters:  08/14/18 253 lb 8 oz (115 kg)  08/09/18 250 lb 10.6 oz (113.7 kg)  04/15/17 262 lb (118.8 kg)    Physical Exam  Constitutional: He appears well-developed and well-nourished. No distress.  HENT:  Mouth/Throat: Oropharynx is clear and moist. No oropharyngeal exudate.  Eyes: Pupils are equal, round, and reactive to light. EOM are normal.  Neck: Carotid bruit is not present.  Cardiovascular: Normal rate, regular rhythm and normal heart sounds.  No murmur heard. Pulmonary/Chest: Effort normal and breath sounds normal. No respiratory distress. He has no wheezes. He has no rales.  Musculoskeletal: He exhibits no edema.  Skin: Skin is warm and dry. No rash noted.  Psychiatric: He has a normal mood and affect.  Nursing note and vitals reviewed.  Results for orders placed or performed during the hospital encounter of 08/08/18  Glucose, capillary  Result Value Ref Range   Glucose-Capillary 111 (H) 70 - 99 mg/dL  Protime-INR  Result Value Ref Range   Prothrombin Time 12.8 11.4 - 15.2 seconds   INR 0.97  APTT  Result Value Ref Range   aPTT 56 (H) 24 - 36 seconds  CBC  Result Value Ref Range   WBC 5.4 4.0 - 10.5 K/uL   RBC 5.28 4.22 - 5.81 MIL/uL   Hemoglobin 17.5 (H) 13.0 - 17.0 g/dL   HCT 16.1 09.6 - 04.5 %   MCV 91.9 80.0 - 100.0 fL   MCH 33.1 26.0 - 34.0 pg   MCHC 36.1 (H) 30.0 - 36.0 g/dL   RDW 40.9 81.1 - 91.4 %   Platelets 180 150 - 400 K/uL   nRBC 0.0 0.0 - 0.2 %  Differential  Result Value Ref Range   Neutrophils Relative % 45 %   Neutro Abs 2.4 1.7 - 7.7 K/uL   Lymphocytes Relative 40 %   Lymphs Abs 2.1 0.7 - 4.0 K/uL    Monocytes Relative 9 %   Monocytes Absolute 0.5 0.1 - 1.0 K/uL   Eosinophils Relative 5 %   Eosinophils Absolute 0.3 0.0 - 0.5 K/uL   Basophils Relative 1 %   Basophils Absolute 0.1 0.0 - 0.1 K/uL   Immature Granulocytes 0 %   Abs Immature Granulocytes 0.02 0.00 - 0.07 K/uL  Comprehensive metabolic panel  Result Value Ref Range   Sodium 139 135 - 145 mmol/L   Potassium 3.8 3.5 - 5.1 mmol/L   Chloride 103 98 - 111 mmol/L   CO2 26 22 - 32 mmol/L   Glucose, Bld 116 (H) 70 - 99 mg/dL   BUN 17 6 - 20 mg/dL   Creatinine, Ser 7.82 0.61 - 1.24 mg/dL   Calcium 9.9 8.9 - 95.6 mg/dL   Total Protein 7.7 6.5 - 8.1 g/dL   Albumin 4.9 3.5 - 5.0 g/dL   AST 35 15 - 41 U/L   ALT 54 (H) 0 - 44 U/L   Alkaline Phosphatase 61 38 - 126 U/L   Total Bilirubin 0.8 0.3 - 1.2 mg/dL   GFR calc non Af Amer >60 >60 mL/min   GFR calc Af Amer >60 >60 mL/min   Anion gap 10 5 - 15  Troponin I  Result Value Ref Range   Troponin I <0.03 <0.03 ng/mL  HIV antibody (Routine Testing)  Result Value Ref Range   HIV Screen 4th Generation wRfx Non Reactive Non Reactive  Hemoglobin A1c  Result Value Ref Range   Hgb A1c MFr Bld 5.4 4.8 - 5.6 %   Mean Plasma Glucose 108.28 mg/dL  Lipid panel  Result Value Ref Range   Cholesterol 238 (H) 0 - 200 mg/dL   Triglycerides 213 (H) <150 mg/dL   HDL 35 (L) >08 mg/dL   Total CHOL/HDL Ratio 6.8 RATIO   VLDL 54 (H) 0 - 40 mg/dL   LDL Cholesterol 657 (H) 0 - 99 mg/dL  ECHOCARDIOGRAM COMPLETE  Result Value Ref Range   Weight 4,010.61 oz   Height 75 in   BP 116/89 mmHg      Assessment & Plan:   Problem List Items Addressed This Visit    TIA (transient ischemic attack) - Primary    Reviewed recent hospitalization course. Continue current meds. BP stable today. Discussed lifelong need for aspirin.       Relevant Orders   Ambulatory referral to Cardiology   Ambulatory referral to Pulmonology   Obesity, Class I, BMI 30-34.9   HTN (hypertension)    Better control -  continue current regimen, consider increased dose if needed.       Dyslipidemia  Chronic. Now on atorvastatin. Tolerating well.  The 10-year ASCVD risk score Denman George DC Montez Hageman., et al., 2013) is: 4.5%   Values used to calculate the score:     Age: 89 years     Sex: Male     Is Non-Hispanic African American: No     Diabetic: No     Tobacco smoker: No     Systolic Blood Pressure: 122 mmHg     Is BP treated: Yes     HDL Cholesterol: 35 mg/dL     Total Cholesterol: 238 mg/dL       Abnormal echocardiogram    Reviewed echo with patient - mild DD, EF 50%, diffuse hypokinesis and dilated RV - rec cards eval for abnormal echo and pulm eval for sleep apnea evaluation. Pt agrees with plan.       Relevant Orders   Ambulatory referral to Cardiology   Ambulatory referral to Pulmonology    Other Visit Diagnoses    Need for influenza vaccination       Relevant Orders   Flu Vaccine QUAD 36+ mos IM (Completed)   Snoring       Relevant Orders   Ambulatory referral to Pulmonology       No orders of the defined types were placed in this encounter.  Orders Placed This Encounter  Procedures  . Flu Vaccine QUAD 36+ mos IM  . Ambulatory referral to Cardiology    Referral Priority:   Routine    Referral Type:   Consultation    Referral Reason:   Specialty Services Required    Requested Specialty:   Cardiology    Number of Visits Requested:   1  . Ambulatory referral to Pulmonology    Referral Priority:   Routine    Referral Type:   Consultation    Referral Reason:   Specialty Services Required    Requested Specialty:   Pulmonary Disease    Number of Visits Requested:   1    Follow up plan: No follow-ups on file.  Eustaquio Boyden, MD

## 2018-08-14 NOTE — Assessment & Plan Note (Signed)
Reviewed recent hospitalization course. Continue current meds. BP stable today. Discussed lifelong need for aspirin.

## 2018-08-15 ENCOUNTER — Encounter: Payer: Self-pay | Admitting: Cardiology

## 2018-08-15 ENCOUNTER — Ambulatory Visit (INDEPENDENT_AMBULATORY_CARE_PROVIDER_SITE_OTHER): Payer: BLUE CROSS/BLUE SHIELD | Admitting: Cardiology

## 2018-08-15 VITALS — BP 124/72 | HR 76 | Ht 74.5 in | Wt 255.0 lb

## 2018-08-15 DIAGNOSIS — E785 Hyperlipidemia, unspecified: Secondary | ICD-10-CM | POA: Diagnosis not present

## 2018-08-15 DIAGNOSIS — I420 Dilated cardiomyopathy: Secondary | ICD-10-CM | POA: Diagnosis not present

## 2018-08-15 DIAGNOSIS — R0602 Shortness of breath: Secondary | ICD-10-CM

## 2018-08-15 DIAGNOSIS — I10 Essential (primary) hypertension: Secondary | ICD-10-CM

## 2018-08-15 DIAGNOSIS — R931 Abnormal findings on diagnostic imaging of heart and coronary circulation: Secondary | ICD-10-CM | POA: Diagnosis not present

## 2018-08-15 NOTE — Patient Instructions (Signed)
Medication Instructions:  Your physician recommends that you continue on your current medications as directed. Please refer to the Current Medication list given to you today.  If you need a refill on your cardiac medications before your next appointment, please call your pharmacy.   Lab work:  If you have labs (blood work) drawn today and your tests are completely normal, you will receive your results only by: Marland Kitchen MyChart Message (if you have MyChart) OR . A paper copy in the mail If you have any lab test that is abnormal or we need to change your treatment, we will call you to review the results.  Testing/Procedures: Your physician has requested that you have a cardiac MRI. Cardiac MRI uses a computer to create images of your heart as its beating, producing both still and moving pictures of your heart and major blood vessels. For further information please visit [email or web address]. Please follow the instruction sheet given to you today for more information.  Your physician has requested that you have an exercise tolerance test. For further information please visit [email or web address] Please also follow instruction sheet, as given.  Follow-Up: As needed once results are received.

## 2018-08-15 NOTE — Progress Notes (Signed)
Cardiology Office Note:    Date:  08/15/2018   ID:  Jeffery Banks, DOB 04/29/1971, MRN [id number]  PCP:  Jeffery Boyden, MD  Cardiologist:  No primary care provider on file.    Referring MD: Jeffery Boyden, MD   Chief Complaint  Patient presents with  . Shortness of Breath  . Chest Pain    History of Present Illness:    Jeffery Banks is a 47 y.o. male with a hx of ETOH abuse in remission, GERD, HTN and obesity who recently had an echo done showing EF 50% with diffuse HK and dilated RV.  Cardiogram done was done for recent TIA earlier this month.  MRI of the brain was negative for CVA carotid duplex was unremarkable and he is now on aspirin.  He was also started on Lipitor for hyperlipidemia.  He has a history of hypertension which has improved on lisinopril HCT.  He was seen by Dr. Kirke Banks for chest pain.  It was felt that he was having panic attacks.  An echo was ordered but I do not see any results.  He is here today for followup and is doing well.  He tells me that when he presented with his TIA symptoms his blood pressures been very high that day.  He says he can tell when his blood pressure gets up and he took his blood pressure was 250 systolic.  He said he started to notice tingling in his right hand which slowly went away up his arm on emergency room visit.  His blood pressures been well controlled since then.  He denies any chest pain or pressure, , PND, orthopnea, LE edema, dizziness, palpitations or syncope.  He says he will occasionally has some shortness of breath but he relates that to anxiety and panic attacks.  Is not really exertional.  He is compliant with his meds and is tolerating meds with no SE.    Past Medical History:  Diagnosis Date  . Alcohol abuse, in remission college  . Generalized headaches    daily  . GERD (gastroesophageal reflux disease)   . HTN (hypertension)   . Hx of migraines    as child  . Obesity    borderline    Past Surgical  History:  Procedure Laterality Date  . APPENDECTOMY  1982    Current Medications: Current Meds  Medication Sig  . aspirin EC 81 MG tablet Take 1 tablet (81 mg total) by mouth daily.  Marland Kitchen atorvastatin (LIPITOR) 40 MG tablet Take 1 tablet (40 mg total) by mouth daily at 6 PM.  . lisinopril-hydrochlorothiazide (PRINZIDE,ZESTORETIC) 10-12.5 MG tablet TAKE 1 TABLET BY MOUTH EVERY DAY     Allergies:   Patient has no known allergies.   Social History   Socioeconomic History  . Marital status: Married    Spouse name: Not on file  . Number of children: Not on file  . Years of education: Not on file  . Highest education level: Not on file  Occupational History  . Not on file  Social Needs  . Financial resource strain: Not hard at all  . Food insecurity:    Worry: Never true    Inability: Never true  . Transportation needs:    Medical: No    Non-medical: No  Tobacco Use  . Smoking status: Former Smoker    Packs/day: 0.25    Years: 7.00    Pack years: 1.75    Types: Cigarettes    Last attempt to  quit: 10/22/2010    Years since quitting: 7.8  . Smokeless tobacco: Never Used  Substance and Sexual Activity  . Alcohol use: No  . Drug use: No  . Sexual activity: Not Currently    Birth control/protection: None  Lifestyle  . Physical activity:    Days per week: Patient refused    Minutes per session: Patient refused  . Stress: To some extent  Relationships  . Social connections:    Talks on phone: Patient refused    Gets together: Patient refused    Attends religious service: Patient refused    Active member of club or organization: Patient refused    Attends meetings of clubs or organizations: Patient refused    Relationship status: Patient refused  Other Topics Concern  . Not on file  Social History Narrative   Caffeine: Decreased coffee in am   Divorced from wife   Has 3 children   Occupation: Transport planner for [employer]   Edu: Associate's degree   Activity: walking at  night   Diet: water daily, some fruits/vegetables, red meat 2x/wk, fish 1x/mo     Family History: The patient's family history includes CAD in his maternal grandmother; Cancer in his maternal aunt; Coronary artery disease (age of onset: 23) in his maternal grandfather; Hypertension in his maternal grandfather, mother, and paternal grandfather; Stroke in his maternal grandfather and maternal grandmother. There is no history of Diabetes.  ROS:   Please see the history of present illness.    ROS  All other systems reviewed and negative.   EKGs/Labs/Other Studies Reviewed:    The following studies were reviewed today: 2D ehco  EKG:  EKG is not ordered today.   Recent Labs: 08/08/2018: ALT 54; BUN 17; Creatinine, Ser 1.13; Hemoglobin 17.5; Platelets 180; Potassium 3.8; Sodium 139   Recent Lipid Panel    Component Value Date/Time   CHOL 238 (H) 08/09/2018 0412   TRIG 270 (H) 08/09/2018 0412   HDL 35 (L) 08/09/2018 0412   CHOLHDL 6.8 08/09/2018 0412   VLDL 54 (H) 08/09/2018 0412   LDLCALC 149 (H) 08/09/2018 0412   LDLDIRECT 158.0 11/21/2015 1725    Physical Exam:    VS:  BP 124/72   Pulse 76   Ht 6' 2.5" (1.892 m)   Wt 255 lb (115.7 kg)   SpO2 97%   BMI 32.30 kg/m     Wt Readings from Last 3 Encounters:  08/15/18 255 lb (115.7 kg)  08/14/18 253 lb 8 oz (115 kg)  08/09/18 250 lb 10.6 oz (113.7 kg)     GEN:  Well nourished, well developed in no acute distress HEENT: Normal NECK: No JVD; No carotid bruits LYMPHATICS: No lymphadenopathy CARDIAC: RRR, no murmurs, rubs, gallops RESPIRATORY:  Clear to auscultation without rales, wheezing or rhonchi  ABDOMEN: Soft, non-tender, non-distended MUSCULOSKELETAL:  No edema; No deformity  SKIN: Warm and dry NEUROLOGIC:  Alert and oriented x 3 PSYCHIATRIC:  Normal affect   ASSESSMENT:    1. Abnormal echocardiogram   2. Essential hypertension   3. Dyslipidemia   4. Dilated cardiomyopathy (HCC)   5. SOB (shortness of  breath)    PLAN:    In order of problems listed above:  1.  Abnormal echo -recent 2D echo done for TIA showed low normal LV function with EF 50-55% with mildly dilated RV.  The echo that was ordered in 2017 with comparison.  I am going to get a cardiac MRI for further evaluation.  Biventricular enlargement  could be due to a prior alcohol.  He does have several cardiac risk factors including family history on his mother side of CAD, personal history of hypertension and tobacco as well as hyperlipidemia.  I will therefore get an exercise type of test as well.  His baseline EKG shows normal sinus rhythm with no ST changes.  2.  HTN -BP is well controlled on exam today.  He will continue lisinopril HCT 10-12.5 mg daily.  Creatinine was stable at 1.13 and potassium 3.8 on 08/08/2017.  3.  Hyperlipidemia -his LDL was 149 on 08/09/2017 is minimal to 40 mg daily.  His PCP is well with this.   Medication Adjustments/Labs and Tests Ordered: Current medicines are reviewed at length with the patient today.  Concerns regarding medicines are outlined above.  Orders Placed This Encounter  Procedures  . MR Card Morphology Wo/W Cm  . Exercise Tolerance Test   No orders of the defined types were placed in this encounter.   Signed, Armanda Magic, MD  08/15/2018 8:32 AM    [facility]

## 2018-08-19 ENCOUNTER — Encounter: Payer: Self-pay | Admitting: Internal Medicine

## 2018-08-19 ENCOUNTER — Ambulatory Visit (INDEPENDENT_AMBULATORY_CARE_PROVIDER_SITE_OTHER): Payer: BLUE CROSS/BLUE SHIELD | Admitting: Internal Medicine

## 2018-08-19 VITALS — BP 138/90 | HR 76 | Ht 74.0 in | Wt 257.2 lb

## 2018-08-19 DIAGNOSIS — G459 Transient cerebral ischemic attack, unspecified: Secondary | ICD-10-CM

## 2018-08-19 DIAGNOSIS — G4719 Other hypersomnia: Secondary | ICD-10-CM | POA: Diagnosis not present

## 2018-08-19 DIAGNOSIS — I10 Essential (primary) hypertension: Secondary | ICD-10-CM

## 2018-08-19 NOTE — Patient Instructions (Addendum)
Plan for sleep Study test to assess for sleep apnea   Obtain CHEST XRAY  Flu shot up to date

## 2018-08-19 NOTE — Progress Notes (Signed)
Name: Jeffery Banks MRN: [id number] DOB: [birth year]     CONSULTATION DATE: 10.29.19 REFERRING MD : Lindalou Hose  CHIEF COMPLAINT: excessive daytime sleepiness  STUDIES:     09/2012 CXR No acute pneumonia, no effusions   HISTORY OF PRESENT ILLNESS:  47 year old pleasant white male seen today for excessive daytime sleepiness Epworth sleep score 16  Patient  has been having sleep problems for many years Patient has been having excessive daytime sleepiness Patient has been having extreme fatigue and tiredness, lack of energy +  very Loud snoring every night + Possible struggling breathe at night and gasps for air  Patient was at work several weeks ago when he developed acute anxiety right arm and right leg numbness and tingling Patient was admitted to the intensive care unit for hypertensive emergency  Echo results - Left ventricle: The cavity size was mildly dilated. Systolic   function was normal. The estimated ejection fraction was 50%.   Diffuse hypokinesis. Doppler parameters are consistent with   abnormal left ventricular relaxation (grade 1 diastolic   dysfunction). - Right ventricle: The cavity size was mildly dilated. - Atrial septum: No defect or patent foramen ovale was identified.  Impressions:  - No cardiac source of emboli was indentified. Mild LV dysfunction   with diffuse hypokinesis and mild diastolic dysfunction and no   thrombii seen in cardiac chambers.   Patient was then referred to cardiology for further assessment work-up And then was sent to [place] for further assessment for sleep apnea     Discussed sleep data and reviewed with patient.  Encouraged proper weight management.  Discussed driving precautions and its relationship with hypersomnolence.  Discussed operating dangerous equipment and its relationship with hypersomnolence.  Discussed sleep hygiene, and benefits of a fixed sleep waked time.  The importance of getting eight or more  hours of sleep discussed with patient.  Discussed limiting the use of the computer and television before bedtime.  Decrease naps during the day, so night time sleep will become enhanced.  Limit caffeine, and sleep deprivation.  HTN, stroke, and heart failure are potential risk factors.     PAST MEDICAL HISTORY :   has a past medical history of Alcohol abuse, in remission (college), Generalized headaches, GERD (gastroesophageal reflux disease), HTN (hypertension), migraines, and Obesity.  has a past surgical history that includes Appendectomy (1982). Prior to Admission medications   Medication Sig Start Date End Date Taking? Authorizing Provider  aspirin EC 81 MG tablet Take 1 tablet (81 mg total) by mouth daily. 08/09/18 11/07/18  Shaune Pollack, MD  atorvastatin (LIPITOR) 40 MG tablet Take 1 tablet (40 mg total) by mouth daily at 6 PM. 08/09/18   Shaune Pollack, MD  lisinopril-hydrochlorothiazide (PRINZIDE,ZESTORETIC) 10-12.5 MG tablet TAKE 1 TABLET BY MOUTH EVERY DAY 06/30/18   Eustaquio Boyden, MD   No Known Allergies  FAMILY HISTORY:  family history includes CAD in his maternal grandmother; Cancer in his maternal aunt; Coronary artery disease (age of onset: 37) in his maternal grandfather; Hypertension in his maternal grandfather, mother, and paternal grandfather; Stroke in his maternal grandfather and maternal grandmother. SOCIAL HISTORY:  reports that he quit smoking about 7 years ago. His smoking use included cigarettes. He has a 1.75 pack-year smoking history. He has never used smokeless tobacco. He reports that he does not drink alcohol or use drugs.  REVIEW OF SYSTEMS:   Constitutional: Negative for fever, chills, weight loss, +malaise/fatigue  HENT: Negative for hearing loss, ear pain, nosebleeds, congestion, sore throat,  neck pain, tinnitus and ear discharge.   Eyes: Negative for blurred vision, double vision, photophobia, pain, discharge and redness.  Respiratory: Negative for cough,  hemoptysis, sputum production, shortness of breath, wheezing and stridor.   Cardiovascular: Negative for chest pain, palpitations, orthopnea, claudication, leg swelling and PND.  Gastrointestinal: Negative for heartburn, nausea, vomiting, abdominal pain, diarrhea, constipation, blood in stool and melena.  Genitourinary: Negative for dysuria, urgency, frequency, hematuria and flank pain.  Musculoskeletal: Negative for myalgias, back pain, joint pain and falls.  Skin: Negative for itching and rash.  Neurological: Negative for dizziness, tingling, tremors, sensory change, speech change, focal weakness, seizures, loss of consciousness, weakness and headaches.  Endo/Heme/Allergies: Negative for environmental allergies and polydipsia. Does not bruise/bleed easily.  ALL OTHER ROS ARE NEGATIVE   BP 138/90 (BP Location: Left Arm, Cuff Size: Normal)   Pulse 76   Ht 6\' 2"  (1.88 m)   Wt 257 lb 3.2 oz (116.7 kg)   SpO2 99%   BMI 33.02 kg/m    Physical Examination:   GENERAL:NAD, no fevers, chills, no weakness no fatigue HEAD: Normocephalic, atraumatic.  EYES: Pupils equal, round, reactive to light. Extraocular muscles intact. No scleral icterus.  MOUTH: Moist mucosal membrane.   EAR, NOSE, THROAT: Clear without exudates. No external lesions.  NECK: Supple. No thyromegaly. No nodules. No JVD.  PULMONARY:CTA B/L no wheezes, no crackles, no rhonchi CARDIOVASCULAR: S1 and S2. Regular rate and rhythm. No murmurs, rubs, or gallops. No edema.  GASTROINTESTINAL: Soft, nontender, nondistended. No masses. Positive bowel sounds.  MUSCULOSKELETAL: No swelling, clubbing, or edema. Range of motion full in all extremities.  NEUROLOGIC: Cranial nerves II through XII are intact. No gross focal neurological deficits.  SKIN: No ulceration, lesions, rashes, or cyanosis. Skin warm and dry. Turgor intact.  PSYCHIATRIC: Mood, affect within normal limits. The patient is awake, alert and oriented x 3. Insight, judgment  intact.      ASSESSMENT / PLAN: 47 year old pleasant white male overweight deconditioned state with recent history of hypertensive emergency with acute TIA in the setting of excessive daytime sleepiness with snoring findings consistent with an probable underlying diagnosis for obstructive sleep apnea  Patient will need sleep study to assess for sleep apnea Follow-up after test completed and therapy has started if needed  Obesity -recommend significant weight loss -recommend changing diet  Deconditioned state -Recommend increased daily activity and exercise   During his time of admission there was no chest x-ray ordered so I will order a chest x-ray to assess for underlying lung disease  Patient  satisfied with Plan of action and management. All questions answered  Lucie Leather, M.D.  [facility]  Medical Director [facility] Medical Director [facility] Cardio-Pulmonary Department

## 2018-08-20 ENCOUNTER — Ambulatory Visit (INDEPENDENT_AMBULATORY_CARE_PROVIDER_SITE_OTHER): Payer: BLUE CROSS/BLUE SHIELD

## 2018-08-20 DIAGNOSIS — R0602 Shortness of breath: Secondary | ICD-10-CM | POA: Diagnosis not present

## 2018-08-20 LAB — EXERCISE TOLERANCE TEST
CHL RATE OF PERCEIVED EXERTION: 17
CSEPED: 9 min
CSEPEDS: 0 s
CSEPEW: 10.1 METS
CSEPHR: 94 %
MPHR: 175 {beats}/min
Peak HR: 166 {beats}/min
Rest HR: 67 {beats}/min

## 2018-09-04 ENCOUNTER — Ambulatory Visit (HOSPITAL_COMMUNITY)
Admission: RE | Admit: 2018-09-04 | Discharge: 2018-09-04 | Disposition: A | Discharge status: Home / Self Care | Payer: BLUE CROSS/BLUE SHIELD | Source: Ambulatory Visit | Attending: Cardiology | Admitting: Cardiology

## 2018-09-04 DIAGNOSIS — I420 Dilated cardiomyopathy: Secondary | ICD-10-CM | POA: Diagnosis not present

## 2018-09-04 MED ORDER — GADOBUTROL 1 MMOL/ML IV SOLN
12.0000 mL | Freq: Once | INTRAVENOUS | Status: AC | PRN
  Administered 2018-09-04: 12 mL via INTRAVENOUS

## 2018-09-21 ENCOUNTER — Other Ambulatory Visit: Payer: Self-pay | Admitting: Family Medicine

## 2019-09-25 ENCOUNTER — Other Ambulatory Visit: Payer: Self-pay | Admitting: Family Medicine

## 2019-12-24 ENCOUNTER — Other Ambulatory Visit: Payer: Self-pay | Admitting: Family Medicine

## 2019-12-24 NOTE — Telephone Encounter (Signed)
Patient needs an appointment for CPE with labs prior please

## 2019-12-24 NOTE — Telephone Encounter (Signed)
Noted  

## 2019-12-24 NOTE — Telephone Encounter (Signed)
Called and left voicemail for patient to call back and schedule cpe and labs. 

## 2020-03-15 ENCOUNTER — Other Ambulatory Visit: Payer: Self-pay | Admitting: Family Medicine

## 2020-03-24 ENCOUNTER — Other Ambulatory Visit: Payer: Self-pay

## 2020-03-24 ENCOUNTER — Ambulatory Visit: Payer: BLUE CROSS/BLUE SHIELD | Admitting: Family Medicine

## 2020-03-24 ENCOUNTER — Ambulatory Visit (INDEPENDENT_AMBULATORY_CARE_PROVIDER_SITE_OTHER): Payer: BC Managed Care – PPO | Admitting: Family Medicine

## 2020-03-24 ENCOUNTER — Encounter: Payer: Self-pay | Admitting: Family Medicine

## 2020-03-24 ENCOUNTER — Ambulatory Visit (INDEPENDENT_AMBULATORY_CARE_PROVIDER_SITE_OTHER)
Admission: RE | Admit: 2020-03-24 | Discharge: 2020-03-24 | Disposition: A | Discharge status: Home / Self Care | Payer: BC Managed Care – PPO | Source: Ambulatory Visit | Attending: Family Medicine | Admitting: Family Medicine

## 2020-03-24 VITALS — BP 160/100 | HR 90 | Temp 98.4°F | Ht 74.0 in | Wt 276.1 lb

## 2020-03-24 DIAGNOSIS — G459 Transient cerebral ischemic attack, unspecified: Secondary | ICD-10-CM

## 2020-03-24 DIAGNOSIS — E785 Hyperlipidemia, unspecified: Secondary | ICD-10-CM

## 2020-03-24 DIAGNOSIS — I10 Essential (primary) hypertension: Secondary | ICD-10-CM

## 2020-03-24 DIAGNOSIS — R400 Somnolence: Secondary | ICD-10-CM

## 2020-03-24 MED ORDER — LISINOPRIL-HYDROCHLOROTHIAZIDE 20-12.5 MG PO TABS
1.0000 | ORAL_TABLET | Freq: Every day | ORAL | 3 refills | Status: DC
Start: 2020-03-24 — End: 2021-03-21

## 2020-03-24 MED ORDER — ATORVASTATIN CALCIUM 40 MG PO TABS: 40.0000 mg | ORAL_TABLET | Freq: Every day | ORAL | 3 refills | Status: DC

## 2020-03-24 MED ORDER — ASPIRIN EC 81 MG PO TBEC: 81.0000 mg | DELAYED_RELEASE_TABLET | Freq: Every day | ORAL | Status: AC

## 2020-03-24 NOTE — Assessment & Plan Note (Signed)
No recurrent symptoms.

## 2020-03-24 NOTE — Patient Instructions (Signed)
Chest xray today Restart 3 medicines daily.  Schedule physical with fasting labs in 3 months.

## 2020-03-24 NOTE — Assessment & Plan Note (Signed)
Chronic, off atorvastatin - will restart. Reviewed indications for statin use in h/o TIA.  The 10-year ASCVD risk score Denman George DC Montez Hageman., et al., 2013) is: 7.9%   Values used to calculate the score:     Age: 49 years     Sex: Male     Is Non-Hispanic African American: No     Diabetic: No     Tobacco smoker: No     Systolic Blood Pressure: 160 mmHg     Is BP treated: Yes     HDL Cholesterol: 35 mg/dL     Total Cholesterol: 238 mg/dL

## 2020-03-24 NOTE — Progress Notes (Signed)
This visit was conducted in person.  BP (!) 160/100 (BP Location: Right Arm, Patient Position: Sitting, Cuff Size: Large)    Pulse 90    Temp 98.4 F (36.9 C) (Temporal)    Ht 6\' 2"  (1.88 m)    Wt 276 lb 2 oz (125.2 kg)    SpO2 96%    BMI 35.45 kg/m   BP Readings from Last 3 Encounters:  03/24/20 (!) 160/100  08/19/18 138/90  08/15/18 124/72    CC: follow up visit Subjective:    Patient ID: Herby Abraham, male    DOB: 09/24/1971, 49 y.o.   MRN: [id number]  HPI: Lonzie Simmer is a 49 y.o. male presenting on 03/24/2020 for Follow-up (Pt states he is here for a check up. )   Last seen 07/2018 - at that time had suffered TIA with R arm numbness/paresthesias after hypertensive emergency s/p reassuring head imaging - CT and MRI. Was started on aspirin and lipitor as well as his lisinopril/hctz. Stroke workup was unrevealing, echocardiogram showed mild LV dysfunction (EF 50%) with diffuse hypokinesis, mild dilated RV. Saw cards in f/u - cardiac MRI returned normal. Saw pulm as well - planned sleep study to evaluate for sleep apnea as contributor to TIA - did not complete sleep study. Did not have CXR completed either.   Did not continue aspirin or lipitor for long.  BP elevated today - ran out of BP meds last week. When on med, BP consistently 130/80-90s.  Denies stroke symptoms. No HA, vision changes, CP/tightness, SOB, leg swelling.   Weight gain noted - but actually has lost weight from peak 290lb. Has started walking daily, cutting out sodas and sweet tea. Motivated for ongoing weight loss efforts.     Relevant past medical, surgical, family and social history reviewed and updated as indicated. Interim medical history since our last visit reviewed. Allergies and medications reviewed and updated. Outpatient Medications Prior to Visit  Medication Sig Dispense Refill   lisinopril-hydrochlorothiazide (ZESTORETIC) 10-12.5 MG tablet TAKE 1 TABLET BY MOUTH EVERY DAY 90 tablet 0    atorvastatin (LIPITOR) 40 MG tablet Take 1 tablet (40 mg total) by mouth daily at 6 PM. 30 tablet 2   No facility-administered medications prior to visit.     Per HPI unless specifically indicated in ROS section below Review of Systems Objective:  BP (!) 160/100 (BP Location: Right Arm, Patient Position: Sitting, Cuff Size: Large)    Pulse 90    Temp 98.4 F (36.9 C) (Temporal)    Ht 6\' 2"  (1.88 m)    Wt 276 lb 2 oz (125.2 kg)    SpO2 96%    BMI 35.45 kg/m   Wt Readings from Last 3 Encounters:  03/24/20 276 lb 2 oz (125.2 kg)  08/19/18 257 lb 3.2 oz (116.7 kg)  08/15/18 255 lb (115.7 kg)      Physical Exam Vitals and nursing note reviewed.  Constitutional:      Appearance: Normal appearance. He is not ill-appearing.  Neck:     Thyroid: No thyromegaly or thyroid tenderness.  Cardiovascular:     Rate and Rhythm: Normal rate and regular rhythm.     Pulses: Normal pulses.     Heart sounds: Normal heart sounds. No murmur.  Pulmonary:     Effort: Pulmonary effort is normal. No respiratory distress.     Breath sounds: Normal breath sounds. No wheezing, rhonchi or rales.  Musculoskeletal:     Right lower leg: No edema.  Left lower leg: No edema.  Skin:    General: Skin is warm and dry.     Findings: No rash.  Neurological:     Mental Status: He is alert.  Psychiatric:        Mood and Affect: Mood normal.        Behavior: Behavior normal.       Assessment & Plan:  This visit occurred during the SARS-CoV-2 public health emergency.  Safety protocols were in place, including screening questions prior to the visit, additional usage of staff PPE, and extensive cleaning of exam room while observing appropriate contact time as indicated for disinfecting solutions.   Problem List Items Addressed This Visit    TIA (transient ischemic attack)    No recurrent symptoms.       Relevant Medications   lisinopril-hydrochlorothiazide (ZESTORETIC) 20-12.5 MG tablet   atorvastatin  (LIPITOR) 40 MG tablet   aspirin EC 81 MG tablet   Other Relevant Orders   DG Chest 2 View   Home sleep test   Severe obesity (BMI 35.0-39.9) with comorbidity (HCC)    Reviewed recent weight gain noted. Pt motivated to continue weight loss efforts, has already started this.       Relevant Orders   Home sleep test   HTN (hypertension) - Primary    Deteriorated, ran out of BP meds last week. however home readings have been running higher than ideal so will restart higher ACEI dose of lisinopril 20/hctz 12.5mg  daily. Reassess at CPE in 3 months with labs at that time.      Relevant Medications   lisinopril-hydrochlorothiazide (ZESTORETIC) 20-12.5 MG tablet   atorvastatin (LIPITOR) 40 MG tablet   aspirin EC 81 MG tablet   Other Relevant Orders   DG Chest 2 View   Dyslipidemia    Chronic, off atorvastatin - will restart. Reviewed indications for statin use in h/o TIA.  The 10-year ASCVD risk score Denman George DC Montez Hageman., et al., 2013) is: 7.9%   Values used to calculate the score:     Age: 42 years     Sex: Male     Is Non-Hispanic African American: No     Diabetic: No     Tobacco smoker: No     Systolic Blood Pressure: 160 mmHg     Is BP treated: Yes     HDL Cholesterol: 35 mg/dL     Total Cholesterol: 238 mg/dL       Relevant Medications   atorvastatin (LIPITOR) 40 MG tablet   Daytime somnolence    H/o excessive daytime somnolence as well as snoring - did not complete previously recommended sleep study - will re order and may need to return to pulm if unable to complete.       Relevant Orders   Home sleep test       Meds ordered this encounter  Medications   lisinopril-hydrochlorothiazide (ZESTORETIC) 20-12.5 MG tablet    Sig: Take 1 tablet by mouth daily.    Dispense:  90 tablet    Refill:  3   atorvastatin (LIPITOR) 40 MG tablet    Sig: Take 1 tablet (40 mg total) by mouth daily at 6 PM.    Dispense:  90 tablet    Refill:  3   aspirin EC 81 MG tablet    Sig: Take 1  tablet (81 mg total) by mouth daily.    Dispense:      Orders Placed This Encounter  Procedures   DG Chest  2 View    Standing Status:   Future    Number of Occurrences:   1    Standing Expiration Date:   03/24/2021    Order Specific Question:   Reason for Exam (SYMPTOM  OR DIAGNOSIS REQUIRED)    Answer:   OSA eval    Order Specific Question:   Preferred imaging location?    Answer:   [facility]    Order Specific Question:   Radiology Contrast Protocol - do NOT remove file path    Answer:   \[email or web address].[email or web address]   Home sleep test    Scheduling Instructions:     Did not complete test ordered 2019 by Dr Belia Heman of [facility]    Order Specific Question:   Where should this test be performed:    Answer:   [facility]    Patient Instructions  Chest xray today Restart 3 medicines daily.  Schedule physical with fasting labs in 3 months.     Follow up plan: Return in about 3 months (around 06/24/2020) for annual exam, prior fasting for blood work.  Eustaquio Boyden, MD

## 2020-03-24 NOTE — Assessment & Plan Note (Signed)
H/o excessive daytime somnolence as well as snoring - did not complete previously recommended sleep study - will re order and may need to return to pulm if unable to complete.

## 2020-03-24 NOTE — Assessment & Plan Note (Signed)
Deteriorated, ran out of BP meds last week. however home readings have been running higher than ideal so will restart higher ACEI dose of lisinopril 20/hctz 12.5mg  daily. Reassess at CPE in 3 months with labs at that time.

## 2020-03-24 NOTE — Assessment & Plan Note (Addendum)
Reviewed recent weight gain noted. Pt motivated to continue weight loss efforts, has already started this.

## 2020-03-25 ENCOUNTER — Telehealth: Payer: Self-pay | Admitting: Internal Medicine

## 2020-03-25 NOTE — Telephone Encounter (Signed)
Pt was contacted the first of the year in 2020. LMOVM for pt to return call to schedule HST. Pt never returned call to schedule.  Spoke with Armando Reichert at  [facility] and advised of the above. Liberty Handy that we would reach out to insurance and obtain PA then contact the patient to come into our office to pick up equipment.  Armando Reichert will contact the patient and advise of this process.  Order printed. Nothing else needed at this time. Rhonda J Cobb

## 2020-04-19 IMAGING — MR MR HEAD W/O CM
11 series · 43 of 48 positions shown · non-contrast
Comparison: CT same day

CLINICAL DATA: Right arm numbness over the last hour.

EXAM:
MRI HEAD WITHOUT CONTRAST
TECHNIQUE: Multiplanar, multiecho pulse sequences of the brain and surrounding
structures were obtained without intravenous contrast.

[Series 5: T1 · sagittal · 5.0mm · 0.62mm/px · 3 of 25 slices shown (1 of 2)]
[im 1/25]
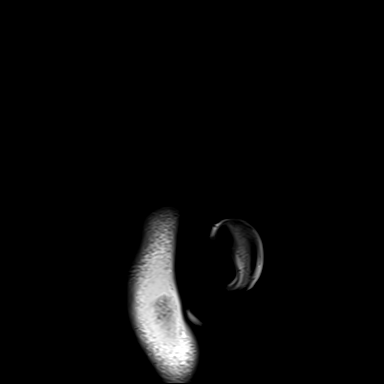
[im 13/25]
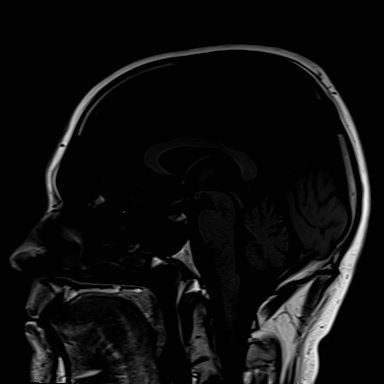
[im 25/25]
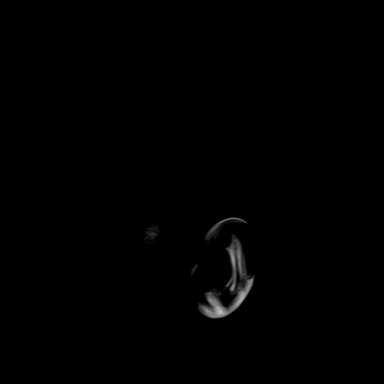

[Series 6: ax dwi_tracew · axial · 3.0mm · 0.60mm/px · z∈[-10,+152]mm · 5 of 55 slices shown]
[im 1/55]
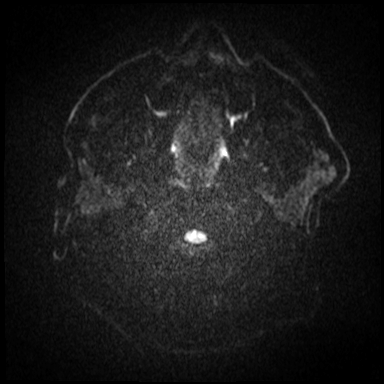
[im 14/55]
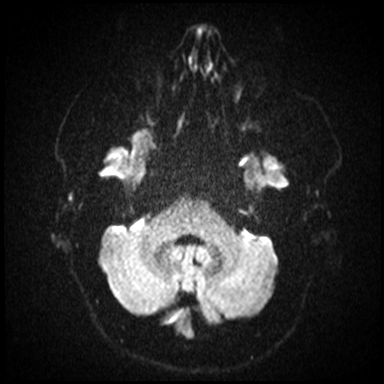
[im 28/55]
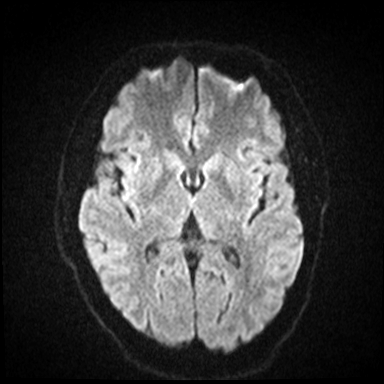
[im 41/55]
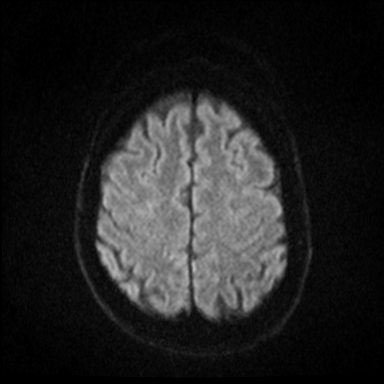
[im 55/55]
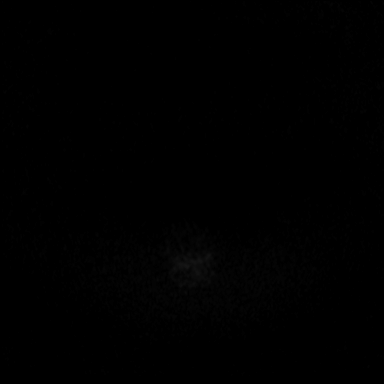

[Series 7: ax dwi_adc · axial · 3.0mm · 0.60mm/px · z∈[-10,+152]mm · 4 of 55 slices shown]
[im 1/55]
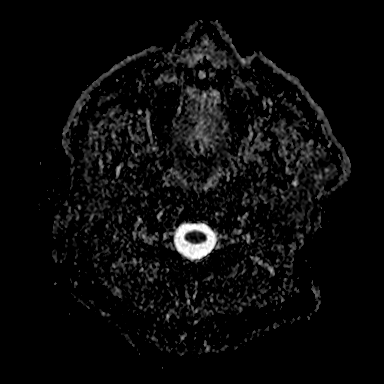
[im 19/55]
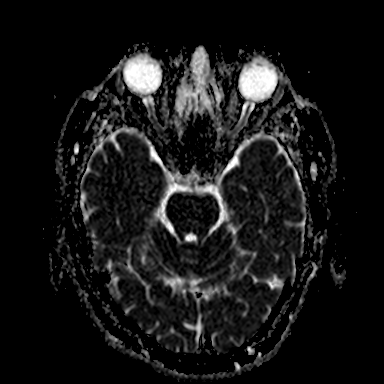
[im 37/55]
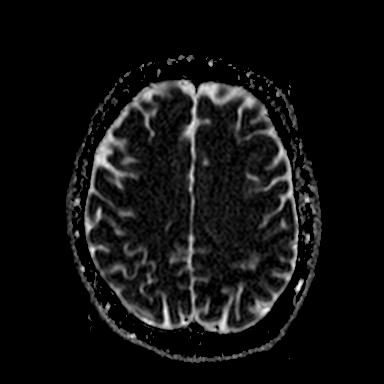
[im 55/55]
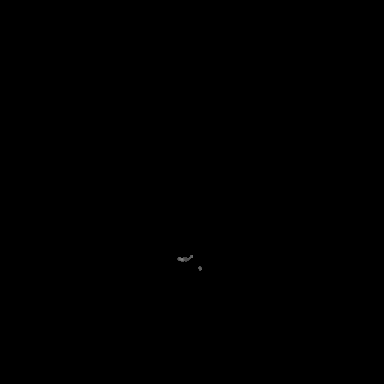

[Series 8: cor dwi_tracew · coronal · 5.0mm · 0.60mm/px · 3 of 45 slices shown]
[im 1/45]
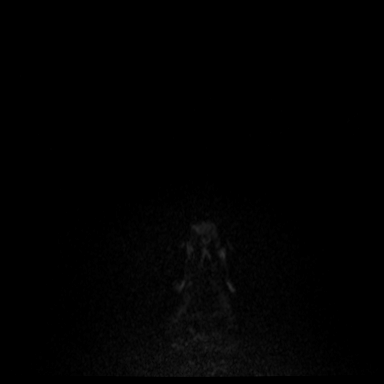
[im 23/45]
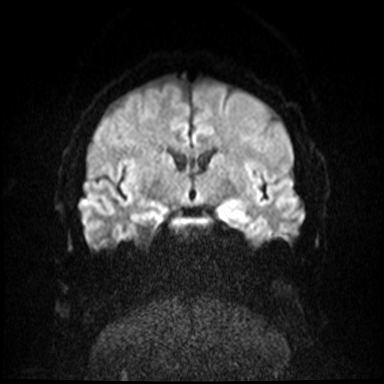
[im 45/45]
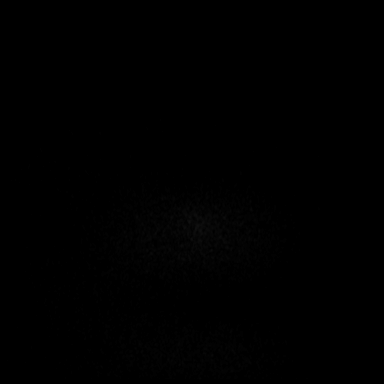

[Series 9: cor dwi_adc · coronal · 5.0mm · 0.60mm/px · 3 of 42 slices shown]
[im 1/42]
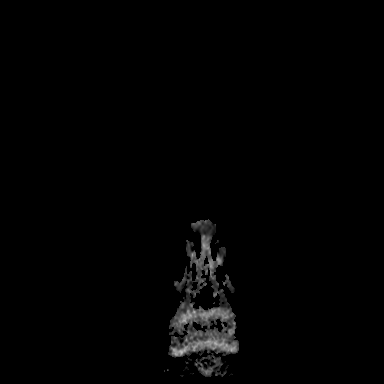
[im 21/42]
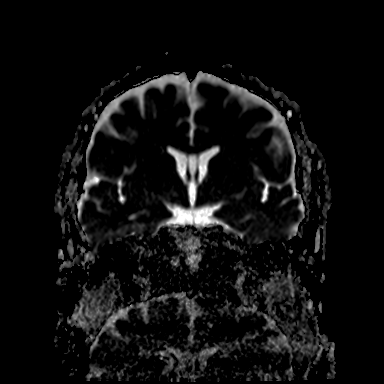
[im 42/42]
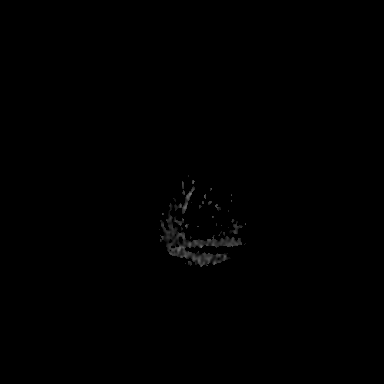

[Series 11: swi_images · axial · 3.0mm · 0.90mm/px · z∈[-39,+150]mm · 5 of 64 slices shown]
[im 1/64]
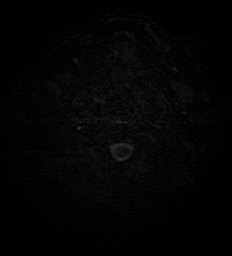
[im 16/64]
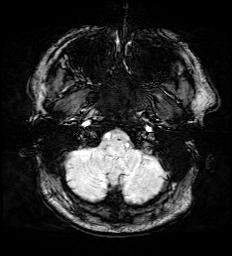
[im 32/64]
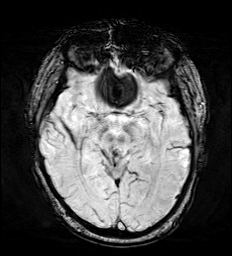
[im 48/64]
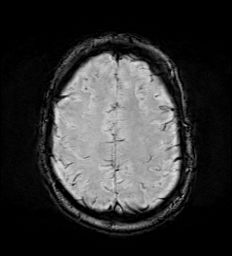
[im 64/64]
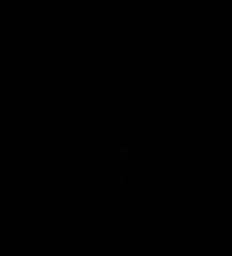

[Series 12: mip_images(sw) · axial · 24.0mm · 0.90mm/px · z∈[-29,+139]mm · 4 of 57 slices shown]
[im 1/57]
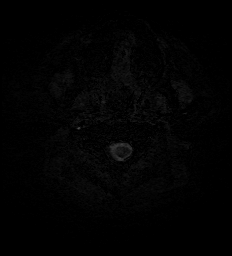
[im 19/57]
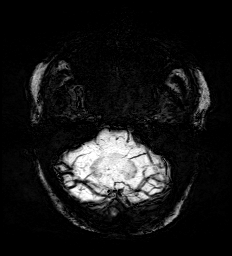
[im 38/57]
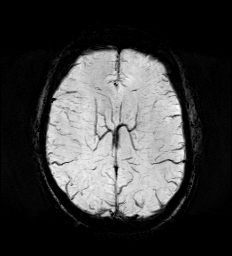
[im 57/57]
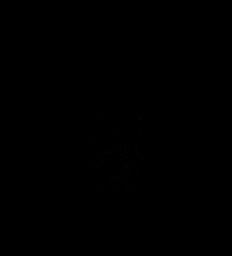

[Series 13: FLAIR · axial · 3.0mm · 0.53mm/px · z∈[-23,+145]mm · 4 of 57 slices shown]
[im 1/57]
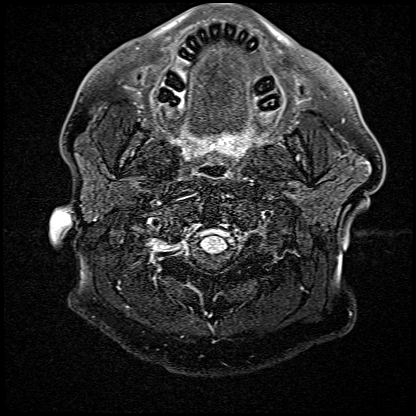
[im 19/57]
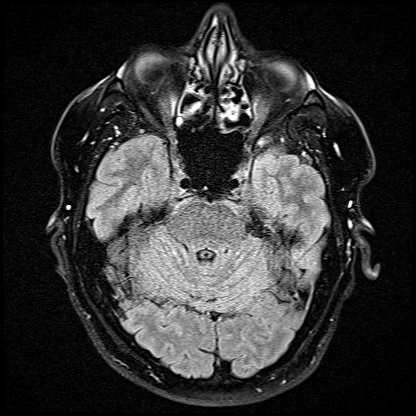
[im 38/57]
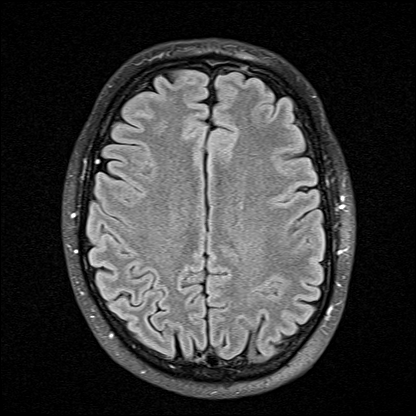
[im 57/57]
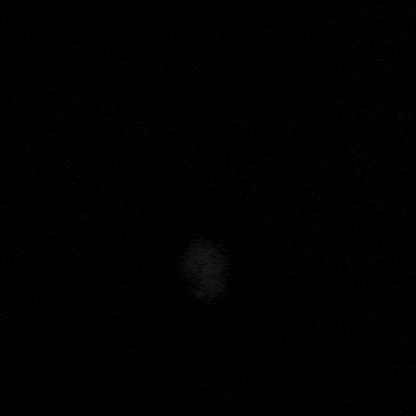

[Series 14: T1 · axial · 1.0mm · 0.98mm/px · z∈[-27,+146]mm · 8 of 174 slices shown (2 of 2)]
[im 1/174]
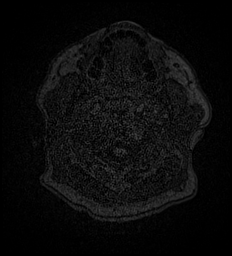
[im 29/174]
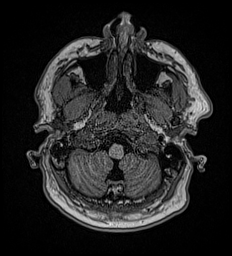
[im 58/174]
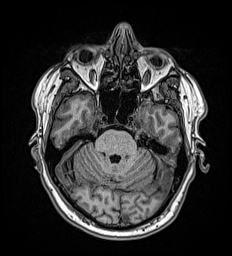
[im 73/174]
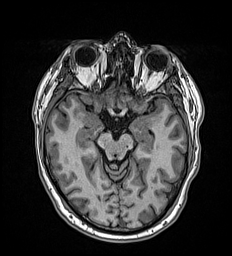
[im 101/174]
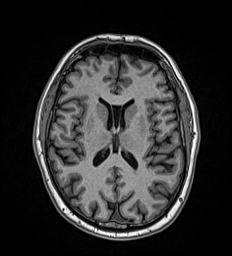
[im 116/174]
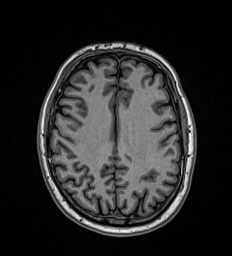
[im 145/174]
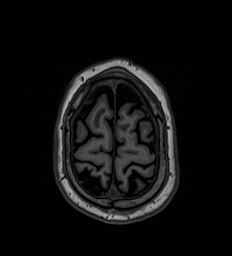
[im 174/174]
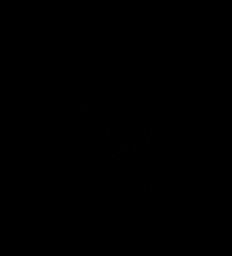

[Series 15: T2 · axial · 5.0mm · 0.53mm/px · z∈[-14,+142]mm · 2 of 27 slices shown (1 of 2)]
[im 1/27]
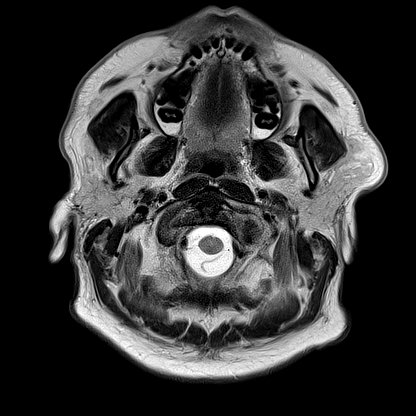
[im 27/27]
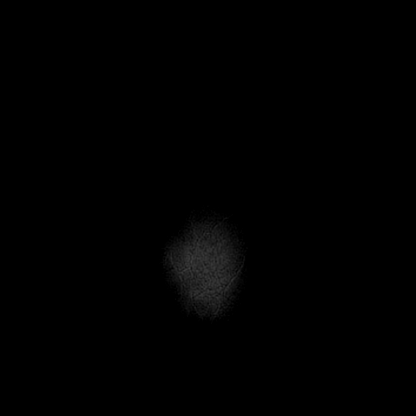

[Series 16: T2 · coronal · 5.0mm · 0.57mm/px · 2 of 29 slices shown (2 of 2)]
[im 1/29]
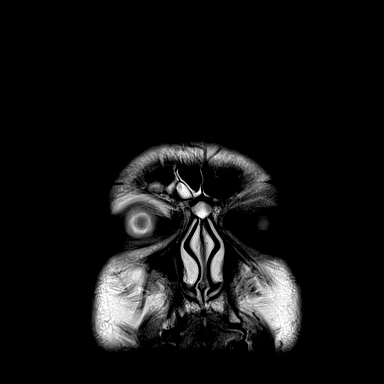
[im 29/29]
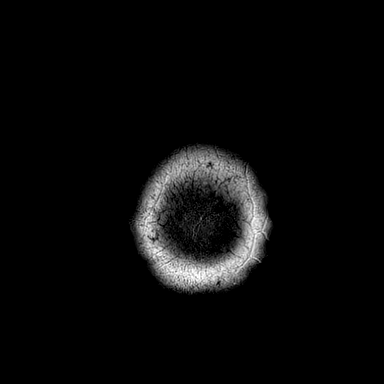

[43 of 48 positions shown; findings below may reference images not displayed]

FINDINGS: Brain: The brain has a normal appearance without evidence of
malformation, atrophy, old or acute small or large vessel
infarction, mass lesion, hemorrhage, hydrocephalus or extra-axial
collection.

Vascular: Major vessels at the base of the brain show flow. Venous
sinuses appear patent.

Skull and upper cervical spine: Normal.

Sinuses/Orbits: Clear/normal.

Other: None significant.
IMPRESSION: Normal examination.

## 2020-05-13 ENCOUNTER — Encounter: Payer: Self-pay | Admitting: Family Medicine

## 2020-10-03 ENCOUNTER — Telehealth: Payer: Self-pay

## 2020-10-03 ENCOUNTER — Other Ambulatory Visit: Payer: Self-pay

## 2020-10-03 ENCOUNTER — Emergency Department (HOSPITAL_COMMUNITY)
Admission: EM | Admit: 2020-10-03 | Discharge: 2020-10-04 | Disposition: A | Discharge status: Home / Self Care | Payer: BC Managed Care – PPO | Attending: Emergency Medicine | Admitting: Emergency Medicine

## 2020-10-03 ENCOUNTER — Encounter (HOSPITAL_COMMUNITY): Payer: Self-pay | Admitting: Emergency Medicine

## 2020-10-03 ENCOUNTER — Emergency Department (HOSPITAL_COMMUNITY): Payer: BC Managed Care – PPO

## 2020-10-03 DIAGNOSIS — Z5321 Procedure and treatment not carried out due to patient leaving prior to being seen by health care provider: Secondary | ICD-10-CM | POA: Diagnosis not present

## 2020-10-03 DIAGNOSIS — Z8673 Personal history of transient ischemic attack (TIA), and cerebral infarction without residual deficits: Secondary | ICD-10-CM | POA: Diagnosis not present

## 2020-10-03 DIAGNOSIS — H538 Other visual disturbances: Secondary | ICD-10-CM | POA: Diagnosis not present

## 2020-10-03 LAB — I-STAT CHEM 8, ED
BUN: 23 mg/dL — ABNORMAL HIGH (ref 6–20)
Calcium, Ion: 1.08 mmol/L — ABNORMAL LOW (ref 1.15–1.40)
Chloride: 87 mmol/L — ABNORMAL LOW (ref 98–111)
Creatinine, Ser: 1.4 mg/dL — ABNORMAL HIGH (ref 0.61–1.24)
Glucose, Bld: >700 mg/dL (ref 70–99)
HCT: 43 % (ref 39.0–52.0)
Hemoglobin: 14.6 g/dL (ref 13.0–17.0)
Potassium: 5 mmol/L (ref 3.5–5.1)
Sodium: 122 mmol/L — ABNORMAL LOW (ref 135–145)
TCO2: 25 mmol/L (ref 22–32)

## 2020-10-03 LAB — COMPREHENSIVE METABOLIC PANEL
ALT: 80 U/L — ABNORMAL HIGH (ref 0–44)
AST: 52 U/L — ABNORMAL HIGH (ref 15–41)
Albumin: 4.3 g/dL (ref 3.5–5.0)
Alkaline Phosphatase: 91 U/L (ref 38–126)
Anion gap: 12 (ref 5–15)
BUN: 18 mg/dL (ref 6–20)
CO2: 25 mmol/L (ref 22–32)
Calcium: 10 mg/dL (ref 8.9–10.3)
Chloride: 85 mmol/L — ABNORMAL LOW (ref 98–111)
Creatinine, Ser: 1.55 mg/dL — ABNORMAL HIGH (ref 0.61–1.24)
GFR, Estimated: 55 mL/min — ABNORMAL LOW (ref >60)
Glucose, Bld: 851 mg/dL (ref 70–99)
Potassium: 5.1 mmol/L (ref 3.5–5.1)
Sodium: 122 mmol/L — ABNORMAL LOW (ref 135–145)
Total Bilirubin: 0.9 mg/dL (ref 0.3–1.2)
Total Protein: 6.6 g/dL (ref 6.5–8.1)

## 2020-10-03 LAB — PROTIME-INR
INR: 0.9 (ref 0.8–1.2)
Prothrombin Time: 12 seconds (ref 11.4–15.2)

## 2020-10-03 LAB — APTT: aPTT: 40 seconds — ABNORMAL HIGH (ref 24–36)

## 2020-10-03 LAB — CBG MONITORING, ED: Glucose-Capillary: >600 mg/dL (ref 70–99)

## 2020-10-03 MED ORDER — SODIUM CHLORIDE 0.9% FLUSH
3.0000 mL | Freq: Once | INTRAVENOUS | Status: DC
Start: 2020-10-03 — End: 2020-10-04

## 2020-10-03 NOTE — Telephone Encounter (Signed)
Has not gone to ER yet - plz call tomorrow for update on symptoms. Needs evaluation.

## 2020-10-03 NOTE — Telephone Encounter (Signed)
[facility] Day - Client TELEPHONE ADVICE RECORD [facility] Patient Name: Jeffery Banks Gender: Male DOB: 07-Nov-1970 Age: 49 Y 5 M 25 D Return Phone Number: [phone] (Primary), [phone] (Secondary) Address[place] [address] Client [facility] Day - Client Client Site [facility] - Day Physician Eustaquio Boyden - MD Contact Type Call Who Is Calling Patient / Member / Family / Caregiver Call Type Triage / Clinical Caller Name Bruk Tumolo Relationship To Patient Self Return Phone Number [phone] (Primary) Chief Complaint Vision - (non urgent symptoms) Reason for Call Symptomatic / Request for Health Information Initial Comment Caller states the pt is having blurry vision . He has a Hx of high blood pressure. These symptoms started Friday morning. Up close he can see nromal and above 8 ft away he is unable to make out road signs or faces. GOTO Facility Not Listed [facility] No Nurse Assessment Nurse: Freida Busman, RN, Diane Date/Time Lamount Cohen Time): 10/03/2020 2:56:03 PM Confirm and document reason for call. If symptomatic, describe symptoms. ---Caller states the pt is having blurry vision . He has a Hx of high blood pressure. These symptoms started Friday morning. Up close he can see normal and above 8 ft away he is unable to make out road signs or faces. No headaches, chest pain or sob. Pt. states he is very thirsty/dry mouth. Pt. is also having some leg cramps. BP was 122/78. Does the patient have any new or worsening symptoms? ---Yes Will a triage be completed? ---Yes Related visit to physician within the last 2 weeks? ---No Does the PT have any chronic conditions? (i.e. diabetes, asthma, this includes High risk factors for pregnancy, etc.) ---Yes List chronic conditions. ---HTN, acid reflux Is this a behavioral health or substance abuse call?  ---No Guidelines Guideline Title Affirmed Question Affirmed Notes Nurse Date/Time (Eastern Time) Vision Loss or Change [1] Blurred vision or visual changes AND [2] present now AND [3] sudden onset or new (e.g., minutes, hours, Freida Busman, RN, Diane 10/03/2020 2:57:55 PM PLEASE NOTE: All timestamps contained within this report are represented as [place] Standard Time. CONFIDENTIALTY NOTICE: This fax transmission is intended only for the addressee. It contains information that is legally privileged, confidential or otherwise protected from use or disclosure. If you are not the intended recipient, you are strictly prohibited from reviewing, disclosing, copying using or disseminating any of this information or taking any action in reliance on or regarding this information. If you have received this fax in error, please notify us immediately by telephone so that we can arrange for its return to [place]. Phone: [phone], Toll-Free: [phone], Fax: [phone] Page: 2 of 2 Call Id: [id number] Guidelines Guideline Title Affirmed Question Affirmed Notes Nurse Date/Time Lamount Cohen Time) days) (Exception: seeing floaters / black specks OR previously diagnosed migraine headaches with same symptoms) Disp. Time Lamount Cohen Time) Disposition Final User 10/03/2020 2:59:34 PM Go to ED Now (or PCP triage) Yes Freida Busman, RN, Diane Caller Disagree/Comply Comply Caller Understands Yes PreDisposition Did not know what to do Care Advice Given Per Guideline GO TO ED NOW (OR PCP TRIAGE): ANOTHER ADULT SHOULD DRIVE: * It is better and safer if another adult drives instead of you. CARE ADVICE given per Vision Loss or Change (Adult) guideline. Referrals GO TO FACILITY OTHER - SPECIFY

## 2020-10-03 NOTE — ED Triage Notes (Signed)
Patient reports sudden onset blurred vision at both eyes Friday morning , history of stroke , speech clear with no facial asymmetry , no arm drift/equal strong grips .

## 2020-10-03 NOTE — Telephone Encounter (Signed)
Unable to reach pt by phone but per access nurse disposition pt agreed to go to ED. FYI to Dr Jannet Mantis CMA.

## 2020-10-04 ENCOUNTER — Encounter: Payer: Self-pay | Admitting: Emergency Medicine

## 2020-10-04 ENCOUNTER — Emergency Department
Admission: EM | Admit: 2020-10-04 | Discharge: 2020-10-04 | Disposition: A | Discharge status: Home / Self Care | Payer: BC Managed Care – PPO | Attending: Emergency Medicine | Admitting: Emergency Medicine

## 2020-10-04 DIAGNOSIS — I10 Essential (primary) hypertension: Secondary | ICD-10-CM | POA: Diagnosis not present

## 2020-10-04 DIAGNOSIS — Z79899 Other long term (current) drug therapy: Secondary | ICD-10-CM | POA: Insufficient documentation

## 2020-10-04 DIAGNOSIS — E1165 Type 2 diabetes mellitus with hyperglycemia: Secondary | ICD-10-CM | POA: Diagnosis not present

## 2020-10-04 DIAGNOSIS — Z87891 Personal history of nicotine dependence: Secondary | ICD-10-CM | POA: Diagnosis not present

## 2020-10-04 DIAGNOSIS — Z7984 Long term (current) use of oral hypoglycemic drugs: Secondary | ICD-10-CM | POA: Insufficient documentation

## 2020-10-04 DIAGNOSIS — Z7982 Long term (current) use of aspirin: Secondary | ICD-10-CM | POA: Diagnosis not present

## 2020-10-04 DIAGNOSIS — R739 Hyperglycemia, unspecified: Secondary | ICD-10-CM | POA: Diagnosis present

## 2020-10-04 LAB — CBC
HCT: 42.4 % (ref 39.0–52.0)
HCT: 42.1 % (ref 39.0–52.0)
Hemoglobin: 15 g/dL (ref 13.0–17.0)
Hemoglobin: 16.1 g/dL (ref 13.0–17.0)
MCH: 33.4 pg (ref 26.0–34.0)
MCH: 32.8 pg (ref 26.0–34.0)
MCHC: 35.4 g/dL (ref 30.0–36.0)
MCHC: 38.2 g/dL — ABNORMAL HIGH (ref 30.0–36.0)
MCV: 94.4 fL (ref 80.0–100.0)
MCV: 85.7 fL (ref 80.0–100.0)
Platelets: 195 10*3/uL (ref 150–400)
Platelets: 217 10*3/uL (ref 150–400)
RBC: 4.49 MIL/uL (ref 4.22–5.81)
RBC: 4.91 MIL/uL (ref 4.22–5.81)
RDW: 12.7 % (ref 11.5–15.5)
RDW: 11.4 % — ABNORMAL LOW (ref 11.5–15.5)
WBC: 4.5 10*3/uL (ref 4.0–10.5)
WBC: 4.8 10*3/uL (ref 4.0–10.5)
nRBC: 0 % (ref 0.0–0.2)
nRBC: 0 % (ref 0.0–0.2)

## 2020-10-04 LAB — URINALYSIS, COMPLETE (UACMP) WITH MICROSCOPIC
Bacteria, UA: NONE SEEN
Bilirubin Urine: NEGATIVE
Glucose, UA: >=500 mg/dL — AB
Hgb urine dipstick: NEGATIVE
Ketones, ur: 20 mg/dL — AB
Leukocytes,Ua: NEGATIVE
Nitrite: NEGATIVE
Protein, ur: NEGATIVE mg/dL
Specific Gravity, Urine: 1.022 (ref 1.005–1.030)
Squamous Epithelial / HPF: NONE SEEN (ref 0–5)
pH: 5 (ref 5.0–8.0)

## 2020-10-04 LAB — DIFFERENTIAL
Abs Immature Granulocytes: 0.01 10*3/uL (ref 0.00–0.07)
Basophils Absolute: 0 10*3/uL (ref 0.0–0.1)
Basophils Relative: 1 %
Eosinophils Absolute: 0.1 10*3/uL (ref 0.0–0.5)
Eosinophils Relative: 3 %
Immature Granulocytes: 0 %
Lymphocytes Relative: 39 %
Lymphs Abs: 1.7 10*3/uL (ref 0.7–4.0)
Monocytes Absolute: 0.3 10*3/uL (ref 0.1–1.0)
Monocytes Relative: 7 %
Neutro Abs: 2.2 10*3/uL (ref 1.7–7.7)
Neutrophils Relative %: 50 %

## 2020-10-04 LAB — HEMOGLOBIN A1C
Hgb A1c MFr Bld: 9.6 % — ABNORMAL HIGH (ref 4.8–5.6)
Mean Plasma Glucose: 228.82 mg/dL

## 2020-10-04 LAB — BASIC METABOLIC PANEL
Anion gap: 13 (ref 5–15)
BUN: 18 mg/dL (ref 6–20)
CO2: 25 mmol/L (ref 22–32)
Calcium: 9.9 mg/dL (ref 8.9–10.3)
Chloride: 89 mmol/L — ABNORMAL LOW (ref 98–111)
Creatinine, Ser: 1.18 mg/dL (ref 0.61–1.24)
GFR, Estimated: >60 mL/min (ref >60)
Glucose, Bld: 435 mg/dL — ABNORMAL HIGH (ref 70–99)
Potassium: 3.8 mmol/L (ref 3.5–5.1)
Sodium: 127 mmol/L — ABNORMAL LOW (ref 135–145)

## 2020-10-04 LAB — CBG MONITORING, ED
Glucose-Capillary: 433 mg/dL — ABNORMAL HIGH (ref 70–99)
Glucose-Capillary: 353 mg/dL — ABNORMAL HIGH (ref 70–99)

## 2020-10-04 MED ORDER — ONDANSETRON 4 MG PO TBDP: 4.0000 mg | ORAL_TABLET | Freq: Three times a day (TID) | ORAL | 0 refills | Status: DC | PRN

## 2020-10-04 MED ORDER — BLOOD GLUCOSE MONITOR KIT: PACK | 0 refills | Status: DC

## 2020-10-04 MED ORDER — METFORMIN HCL 500 MG PO TABS
500.0000 mg | ORAL_TABLET | Freq: Two times a day (BID) | ORAL | 0 refills | Status: DC
Start: 2020-10-04 — End: 2020-11-04

## 2020-10-04 MED ORDER — SODIUM CHLORIDE 0.9 % IV BOLUS
1000.0000 mL | Freq: Once | INTRAVENOUS | Status: AC
  Administered 2020-10-04: 13:00:00 1000 mL via INTRAVENOUS

## 2020-10-04 MED ORDER — POTASSIUM CHLORIDE 20 MEQ PO PACK
40.0000 meq | PACK | Freq: Once | ORAL | Status: AC
  Administered 2020-10-04: 13:00:00 40 meq via ORAL
  Filled 2020-10-04: qty 2

## 2020-10-04 MED ORDER — INSULIN ASPART 100 UNIT/ML ~~LOC~~ SOLN
6.0000 [IU] | Freq: Once | SUBCUTANEOUS | Status: AC
  Administered 2020-10-04: 13:00:00 6 [IU] via INTRAVENOUS
  Filled 2020-10-04: qty 1

## 2020-10-04 NOTE — Telephone Encounter (Signed)
Spoke with pt relaying Dr. Timoteo Expose message.  Pt verbalizes understanding and states he will go to [facility] ED today.  FYI to Dr. Reece Agar.

## 2020-10-04 NOTE — Discharge Instructions (Addendum)
Please start taking metformin tonight.  Continue drinking lots of fluids to stay well hydrated.  You should start checking your blood sugar at home before meals and keep a written log to share with your doctor when you follow up.

## 2020-10-04 NOTE — Telephone Encounter (Signed)
Noted seen at [facility] ER, note reviewed. Started on metformin 500mg  bid. Will review at [facility] later this week.

## 2020-10-04 NOTE — ED Provider Notes (Signed)
[facility] Emergency Department Provider Note  ____________________________________________  Time seen: Approximately 2:28 PM  I have reviewed the triage vital signs and the nursing notes.   HISTORY  Chief Complaint Hyperglycemia    HPI Jeffery Banks is a 49 y.o. male with a history of GERD hypertension who comes ED complaining of blurry vision for the past 4 days, gradual onset, continuous, no associated weakness paresthesias or headaches, no dizziness.  No change in balance or coordination.  It is associated with polyuria and polydipsia during this time.  No history of diabetes.  No aggravating or alleviating factors.  Patient went to [facility] ED yesterday, had triage labs and a CT scan of the head but left without being seen.     Past Medical History:  Diagnosis Date   Alcohol abuse, in remission college   Generalized headaches    daily   GERD (gastroesophageal reflux disease)    HTN (hypertension)    Hx of migraines    as child   Obesity    borderline     Patient Active Problem List   Diagnosis Date Noted   Daytime somnolence 03/24/2020   Abnormal echocardiogram 08/14/2018   TIA (transient ischemic attack) 08/08/2018   Health maintenance alteration 04/15/2017   Dyslipidemia 04/15/2017   Stress due to marital problems 11/21/2015   HTN (hypertension) 01/04/2012   GERD (gastroesophageal reflux disease)    Generalized headaches    Severe obesity (BMI 35.0-39.9) with comorbidity [facility])      Past Surgical History:  Procedure Laterality Date   APPENDECTOMY  1982     Prior to Admission medications   Medication Sig Start Date End Date Taking? Authorizing Provider  aspirin EC 81 MG tablet Take 1 tablet (81 mg total) by mouth daily. 03/24/20   Ria Bush, MD  atorvastatin (LIPITOR) 40 MG tablet Take 1 tablet (40 mg total) by mouth daily at 6 PM. 03/24/20   Ria Bush, MD  blood glucose meter kit and  supplies KIT Dispense based on patient and insurance preference. Use up to four times daily as directed. (FOR ICD-9 250.00, 250.01). 10/04/20   Carrie Mew, MD  lisinopril-hydrochlorothiazide (ZESTORETIC) 20-12.5 MG tablet Take 1 tablet by mouth daily. 03/24/20   Ria Bush, MD  metFORMIN (GLUCOPHAGE) 500 MG tablet Take 1 tablet (500 mg total) by mouth 2 (two) times daily with a meal. 10/04/20   Carrie Mew, MD  ondansetron (ZOFRAN ODT) 4 MG disintegrating tablet Take 1 tablet (4 mg total) by mouth every 8 (eight) hours as needed for nausea or vomiting. 10/04/20   Carrie Mew, MD     Allergies Patient has no known allergies.   Family History  Problem Relation Age of Onset   Hypertension Mother    Cancer Maternal Aunt        beast   Hypertension Maternal Grandfather    Coronary artery disease Maternal Grandfather 18       MI   Stroke Maternal Grandfather    Hypertension Paternal Grandfather    Stroke Maternal Grandmother    CAD Maternal Grandmother        MI   Diabetes Neg Hx     Social History Social History   Tobacco Use   Smoking status: Former Smoker    Packs/day: 0.25    Years: 7.00    Pack years: 1.75    Types: Cigarettes    Quit date: 10/22/2010    Years since quitting: 9.9   Smokeless tobacco: Never  Used  Vaping Use   Vaping Use: Never used  Substance Use Topics   Alcohol use: No   Drug use: No    Review of Systems  Constitutional:   No fever or chills.  ENT:   No sore throat. No rhinorrhea. Cardiovascular:   No chest pain or syncope. Respiratory:   No dyspnea or cough. Gastrointestinal:   Negative for abdominal pain, vomiting and diarrhea.  Musculoskeletal:   Negative for focal pain or swelling All other systems reviewed and are negative except as documented above in ROS and HPI.  ____________________________________________   PHYSICAL EXAM:  VITAL SIGNS: ED Triage Vitals  Enc Vitals Group     BP 10/04/20 1041  (!) 136/93     Pulse Rate 10/04/20 1041 98     Resp 10/04/20 1041 16     Temp 10/04/20 1041 98.3 F (36.8 C)     Temp Source 10/04/20 1041 Oral     SpO2 10/04/20 1041 95 %     Weight 10/04/20 1039 286 lb 9.6 oz (130 kg)     Height 10/04/20 1039 6\' 4"  (1.93 m)     Head Circumference --      Peak Flow --      Pain Score 10/04/20 1039 0     Pain Loc --      Pain Edu? --      Excl. in GC? --     Vital signs reviewed, nursing assessments reviewed.   Constitutional:   Alert and oriented. Non-toxic appearance. Eyes:   Conjunctivae are normal. EOMI. PERRL. ENT      Head:   Normocephalic and atraumatic.      Nose:   Wearing a mask.      Mouth/Throat:   Wearing a mask.      Neck:   No meningismus. Full ROM. Hematological/Lymphatic/Immunilogical:   No cervical lymphadenopathy. Cardiovascular:   RRR. Symmetric bilateral radial and DP pulses.  No murmurs. Cap refill less than 2 seconds. Respiratory:   Normal respiratory effort without tachypnea/retractions. Breath sounds are clear and equal bilaterally. No wheezes/rales/rhonchi. Gastrointestinal:   Soft and nontender. Non distended. There is no CVA tenderness.  No rebound, rigidity, or guarding. Musculoskeletal:   Normal range of motion in all extremities. No joint effusions.  No lower extremity tenderness.  No edema. Neurologic:   Normal speech and language.  Cranial nerves III through XII intact Motor grossly intact. No acute focal neurologic deficits are appreciated.  Skin:    Skin is warm, dry and intact. No rash noted.  No petechiae, purpura, or bullae.  ____________________________________________    LABS (pertinent positives/negatives) (all labs ordered are listed, but only abnormal results are displayed) Labs Reviewed  BASIC METABOLIC PANEL - Abnormal; Notable for the following components:      Result Value   Sodium 127 (*)    Chloride 89 (*)    Glucose, Bld 435 (*)    All other components within normal limits  CBC -  Abnormal; Notable for the following components:   MCHC 38.2 (*)    RDW 11.4 (*)    All other components within normal limits  URINALYSIS, COMPLETE (UACMP) WITH MICROSCOPIC - Abnormal; Notable for the following components:   Color, Urine YELLOW (*)    APPearance CLEAR (*)    Glucose, UA >=500 (*)    Ketones, ur 20 (*)    All other components within normal limits  CBG MONITORING, ED - Abnormal; Notable for the following components:   Glucose-Capillary  433 (*)    All other components within normal limits  CBG MONITORING, ED - Abnormal; Notable for the following components:   Glucose-Capillary 353 (*)    All other components within normal limits  HEMOGLOBIN A1C   ____________________________________________   EKG    ____________________________________________    RADIOLOGY  CT HEAD WO CONTRAST  Result Date: 10/03/2020 CLINICAL DATA:  Blurred vision EXAM: CT HEAD WITHOUT CONTRAST TECHNIQUE: Contiguous axial images were obtained from the base of the skull through the vertex without intravenous contrast. COMPARISON:  08/08/2018 FINDINGS: Brain: No acute intracranial abnormality. Specifically, no hemorrhage, hydrocephalus, mass lesion, acute infarction, or significant intracranial injury. Vascular: No hyperdense vessel or unexpected calcification. Skull: No acute calvarial abnormality. Sinuses/Orbits: Visualized paranasal sinuses and mastoids clear. Orbital soft tissues unremarkable. Other: None IMPRESSION: Normal study. Electronically Signed   By: Rolm Baptise M.D.   On: 10/03/2020 21:19    ____________________________________________   PROCEDURES Procedures  ____________________________________________    CLINICAL IMPRESSION / ASSESSMENT AND PLAN / ED COURSE  Medications ordered in the ED: Medications  sodium chloride 0.9 % bolus 1,000 mL (1,000 mLs Intravenous New Bag/Given 10/04/20 1242)  insulin aspart (novoLOG) injection 6 Units (6 Units Intravenous Given 10/04/20  1241)  sodium chloride 0.9 % bolus 1,000 mL (1,000 mLs Intravenous New Bag/Given 10/04/20 1241)  potassium chloride (KLOR-CON) packet 40 mEq (40 mEq Oral Given 10/04/20 1241)    Pertinent labs & imaging results that were available during my care of the patient were reviewed by me and considered in my medical decision making (see chart for details).  Jeffery Banks was evaluated in Emergency Department on 10/04/2020 for the symptoms described in the history of present illness. He was evaluated in the context of the global COVID-19 pandemic, which necessitated consideration that the patient might be at risk for infection with the SARS-CoV-2 virus that causes COVID-19. Institutional protocols and algorithms that pertain to the evaluation of patients at risk for COVID-19 are in a state of rapid change based on information released by regulatory bodies including the CDC and federal and state organizations. These policies and algorithms were followed during the patient's care in the ED.   Patient presents with blurry vision polyuria polydipsia, found to have new onset of diabetes with a blood sugar today of 433.  I reviewed CT head and labs from [facility] yesterday, CT head is unremarkable, labs showed much higher blood sugar and a lower sodium level of 122, but today this is improved.  No signs of acidosis on today's labs.  He is not in DKA, he is nontoxic well-appearing with normal vital signs.  Patient given 2 L IV saline for hydration, 6 units of aspart insulin IV.  Repeat blood sugar is 350, stable for discharge and initiation of Metformin, prescription for glucometer kit, follow-up with his primary care doctor, Dr. Danise Mina.  We will add an A1c for primary care follow-up.      ____________________________________________   FINAL CLINICAL IMPRESSION(S) / ED DIAGNOSES    Final diagnoses:  Type 2 diabetes mellitus with hyperglycemia, without long-term current use of insulin [facility])      ED Discharge Orders         Ordered    metFORMIN (GLUCOPHAGE) 500 MG tablet  2 times daily with meals        10/04/20 1425    ondansetron (ZOFRAN ODT) 4 MG disintegrating tablet  Every 8 hours PRN        10/04/20 1425  blood glucose meter kit and supplies KIT        10/04/20 1427          Portions of this note were generated with dragon dictation software. Dictation errors may occur despite best attempts at proofreading.   Carrie Mew, MD 10/04/20 [phone]

## 2020-10-04 NOTE — Telephone Encounter (Addendum)
Seen at ER - cbg >700  Left without being seen.  Recommend he increase water intake and go back to the ER for severely elevated sugar readings and new onset diabetes. This is what's causing his blurry vision.

## 2020-10-04 NOTE — ED Notes (Addendum)
Pt called 3x no response  

## 2020-10-04 NOTE — ED Triage Notes (Addendum)
Reports waking up over the weekend with blurry vision. Went to [facility] ED last night and had CT scan, but patient LWBS.   Sent to ED this morning for evaluation of elevated glucose.  AAOx3.  Skin warm and dry. NAD

## 2020-10-05 ENCOUNTER — Telehealth: Payer: Self-pay | Admitting: Family Medicine

## 2020-10-05 NOTE — Telephone Encounter (Signed)
Per chart review pt was seen 10/04/20 at [facility] ED and on 10/03/20 phone note Dr Reece Agar responded and is aware that pt has appt on 10/07/20. Nothing further needed at this time.

## 2020-10-05 NOTE — Telephone Encounter (Signed)
Please triage patient as made an appointment via mychart for blurry vision. Pt appointment is on 12/17.

## 2020-10-07 ENCOUNTER — Encounter: Payer: Self-pay | Admitting: Family Medicine

## 2020-10-07 ENCOUNTER — Other Ambulatory Visit: Payer: Self-pay

## 2020-10-07 ENCOUNTER — Ambulatory Visit (INDEPENDENT_AMBULATORY_CARE_PROVIDER_SITE_OTHER): Payer: BC Managed Care – PPO | Admitting: Family Medicine

## 2020-10-07 VITALS — BP 124/82 | HR 104 | Temp 97.5°F | Ht 76.0 in | Wt 265.3 lb

## 2020-10-07 DIAGNOSIS — I10 Essential (primary) hypertension: Secondary | ICD-10-CM

## 2020-10-07 DIAGNOSIS — E1169 Type 2 diabetes mellitus with other specified complication: Secondary | ICD-10-CM

## 2020-10-07 DIAGNOSIS — IMO0002 Reserved for concepts with insufficient information to code with codable children: Secondary | ICD-10-CM

## 2020-10-07 DIAGNOSIS — Z23 Encounter for immunization: Secondary | ICD-10-CM

## 2020-10-07 DIAGNOSIS — E1165 Type 2 diabetes mellitus with hyperglycemia: Secondary | ICD-10-CM

## 2020-10-07 DIAGNOSIS — E785 Hyperlipidemia, unspecified: Secondary | ICD-10-CM

## 2020-10-07 DIAGNOSIS — E669 Obesity, unspecified: Secondary | ICD-10-CM

## 2020-10-07 DIAGNOSIS — E118 Type 2 diabetes mellitus with unspecified complications: Secondary | ICD-10-CM | POA: Diagnosis not present

## 2020-10-07 DIAGNOSIS — R400 Somnolence: Secondary | ICD-10-CM

## 2020-10-07 MED ORDER — BASAGLAR KWIKPEN 100 UNIT/ML ~~LOC~~ SOPN: 15.0000 [IU] | PEN_INJECTOR | Freq: Every day | SUBCUTANEOUS | 0 refills | Status: DC

## 2020-10-07 MED ORDER — INSULIN PEN NEEDLE 32G X 6 MM MISC: 1 refills | Status: DC

## 2020-10-07 NOTE — Patient Instructions (Addendum)
Flu shot today.  Call [facility] [phone] to ask about home sleep study.  Continue metformin 500mg  twice daily.  Start basaglar (long acting insulin) once daily start at 10 units daily for 1 week then increase to 15 units daily. If morning sugars staying consistently >200, then may increase up to 20 units daily.  Continue working on low sugar low carb diet.  We will refer you to diabetes education classes.  Return as needed or in 4-6 wks for follow up diabetes visit.   Check sugars either right before a meal or 2 hours after a meal. Goal sugars are 80-120 for fasting readings, <180 for mealtime readings.   Diabetes Mellitus and Nutrition, Adult When you have diabetes (diabetes mellitus), it is very important to have healthy eating habits because your blood sugar (glucose) levels are greatly affected by what you eat and drink. Eating healthy foods in the appropriate amounts, at about the same times every day, can help you:  Control your blood glucose.  Lower your risk of heart disease.  Improve your blood pressure.  Reach or maintain a healthy weight. Every person with diabetes is different, and each person has different needs for a meal plan. Your health care provider may recommend that you work with a diet and nutrition specialist (dietitian) to make a meal plan that is best for you. Your meal plan may vary depending on factors such as:  The calories you need.  The medicines you take.  Your weight.  Your blood glucose, blood pressure, and cholesterol levels.  Your activity level.  Other health conditions you have, such as heart or kidney disease. How do carbohydrates affect me? Carbohydrates, also called carbs, affect your blood glucose level more than any other type of food. Eating carbs naturally raises the amount of glucose in your blood. Carb counting is a method for keeping track of how many carbs you eat. Counting carbs is important to keep your blood glucose  at a healthy level, especially if you use insulin or take certain oral diabetes medicines. It is important to know how many carbs you can safely have in each meal. This is different for every person. Your dietitian can help you calculate how many carbs you should have at each meal and for each snack. Foods that contain carbs include:  Bread, cereal, rice, pasta, and crackers.  Potatoes and corn.  Peas, beans, and lentils.  Milk and yogurt.  Fruit and juice.  Desserts, such as cakes, cookies, ice cream, and candy. How does alcohol affect me? Alcohol can cause a sudden decrease in blood glucose (hypoglycemia), especially if you use insulin or take certain oral diabetes medicines. Hypoglycemia can be a life-threatening condition. Symptoms of hypoglycemia (sleepiness, dizziness, and confusion) are similar to symptoms of having too much alcohol. If your health care provider says that alcohol is safe for you, follow these guidelines:  Limit alcohol intake to no more than 1 drink per day for nonpregnant women and 2 drinks per day for men. One drink equals 12 oz of beer, 5 oz of wine, or 1 oz of hard liquor.  Do not drink on an empty stomach.  Keep yourself hydrated with water, diet soda, or unsweetened iced tea.  Keep in mind that regular soda, juice, and other mixers may contain a lot of sugar and must be counted as carbs. What are tips for following this plan?  Reading food labels  Start by checking the serving size on the "Nutrition Facts" label  of packaged foods and drinks. The amount of calories, carbs, fats, and other nutrients listed on the label is based on one serving of the item. Many items contain more than one serving per package.  Check the total grams (g) of carbs in one serving. You can calculate the number of servings of carbs in one serving by dividing the total carbs by 15. For example, if a food has 30 g of total carbs, it would be equal to 2 servings of carbs.  Check  the number of grams (g) of saturated and trans fats in one serving. Choose foods that have low or no amount of these fats.  Check the number of milligrams (mg) of salt (sodium) in one serving. Most people should limit total sodium intake to less than 2,300 mg per day.  Always check the nutrition information of foods labeled as "low-fat" or "nonfat". These foods may be higher in added sugar or refined carbs and should be avoided.  Talk to your dietitian to identify your daily goals for nutrients listed on the label. Shopping  Avoid buying canned, premade, or processed foods. These foods tend to be high in fat, sodium, and added sugar.  Shop around the outside edge of the grocery store. This includes fresh fruits and vegetables, bulk grains, fresh meats, and fresh dairy. Cooking  Use low-heat cooking methods, such as baking, instead of high-heat cooking methods like deep frying.  Cook using healthy oils, such as olive, canola, or sunflower oil.  Avoid cooking with butter, cream, or high-fat meats. Meal planning  Eat meals and snacks regularly, preferably at the same times every day. Avoid going long periods of time without eating.  Eat foods high in fiber, such as fresh fruits, vegetables, beans, and whole grains. Talk to your dietitian about how many servings of carbs you can eat at each meal.  Eat 4-6 ounces (oz) of lean protein each day, such as lean meat, chicken, fish, eggs, or tofu. One oz of lean protein is equal to: ? 1 oz of meat, chicken, or fish. ? 1 egg. ?  cup of tofu.  Eat some foods each day that contain healthy fats, such as avocado, nuts, seeds, and fish. Lifestyle  Check your blood glucose regularly.  Exercise regularly as told by your health care provider. This may include: ? 150 minutes of moderate-intensity or vigorous-intensity exercise each week. This could be brisk walking, biking, or water aerobics. ? Stretching and doing strength exercises, such as yoga  or weightlifting, at least 2 times a week.  Take medicines as told by your health care provider.  Do not use any products that contain nicotine or tobacco, such as cigarettes and e-cigarettes. If you need help quitting, ask your health care provider.  Work with a Veterinary surgeon or diabetes educator to identify strategies to manage stress and any emotional and social challenges. Questions to ask a health care provider  Do I need to meet with a diabetes educator?  Do I need to meet with a dietitian?  What number can I call if I have questions?  When are the best times to check my blood glucose? Where to find more information:  American Diabetes Association: diabetes.org  Academy of Nutrition and Dietetics: www.eatright.[employer] of Diabetes and Digestive and Kidney Diseases (NIH): CarFlippers.tn Summary  A healthy meal plan will help you control your blood glucose and maintain a healthy lifestyle.  Working with a diet and nutrition specialist (dietitian) can help you make a  meal plan that is best for you.  Keep in mind that carbohydrates (carbs) and alcohol have immediate effects on your blood glucose levels. It is important to count carbs and to use alcohol carefully. This information is not intended to replace advice given to you by your health care provider. Make sure you discuss any questions you have with your health care provider. Document Revised: 09/20/2017 Document Reviewed: 11/12/2016 Elsevier Patient Education  2020 [employer].

## 2020-10-07 NOTE — Progress Notes (Signed)
Patient ID: Bayler Gehrig, male    DOB: [birth year], 49 y.o.   MRN: [id number]  This visit was conducted in person.  BP 124/82 (BP Location: Left Arm, Patient Position: Sitting, Cuff Size: Large)   Pulse (!) 104   Temp (!) 97.5 F (36.4 C) (Temporal)   Ht 6' 4" (1.93 m)   Wt 265 lb 5 oz (120.3 kg)   SpO2 97%   BMI 32.29 kg/m    CC: ER f/u visit  Subjective:   HPI: Kalonji Zurawski is a 49 y.o. male presenting on 10/07/2020 for Blurred Vision (C/o blurry vision.  Started on 09/30/20.  Seen at [facility] ED on 10/04/20, dx DM with hyperglycemia. )   Last seen 03/2020.  Recent ER visit for blurry vision polyuria, polydipsia found to have new onset diabetes with initial cbg 800s, dropped to 200-300 after treatment with IVF and insulin dose. ER started metformin 539m BID.  Notes increasing cramping for the past 1.5 wks.  Lab Results  Component Value Date   HGBA1C 9.6 (H) 10/04/2020  He continues aspirin 842mdaily as well as lipitor 4086maily and his antihypertensive.  Denies paresthesias to feet.   He's using One-Touch glucometer and checking QID largely 300s.  Needs to establish with eye doctor.  Has changed diet (cut out sodas, added sugars, breads), has started walking daily (10k steps/day).   HTN - blood pressure very well controlled.   OSA - never completed home sleep study. Advised call pulm clinic to follow up (we've tried twice).      Relevant past medical, surgical, family and social history reviewed and updated as indicated. Interim medical history since our last visit reviewed. Allergies and medications reviewed and updated. Outpatient Medications Prior to Visit  Medication Sig Dispense Refill  . aspirin EC 81 MG tablet Take 1 tablet (81 mg total) by mouth daily.    . aMarland Kitchenorvastatin (LIPITOR) 40 MG tablet Take 1 tablet (40 mg total) by mouth daily at 6 PM. 90 tablet 3  . blood glucose meter kit and supplies KIT Dispense based on patient and insurance preference.  Use up to four times daily as directed. (FOR ICD-9 250.00, 250.01). 1 each 0  . Lancets (ONETOUCH DELICA PLUS [id number] Apply 1 each topically 4 (four) times daily.    . lMarland Kitchensinopril-hydrochlorothiazide (ZESTORETIC) 20-12.5 MG tablet Take 1 tablet by mouth daily. 90 tablet 3  . metFORMIN (GLUCOPHAGE) 500 MG tablet Take 1 tablet (500 mg total) by mouth 2 (two) times daily with a meal. 60 tablet 0  . ondansetron (ZOFRAN ODT) 4 MG disintegrating tablet Take 1 tablet (4 mg total) by mouth every 8 (eight) hours as needed for nausea or vomiting. 20 tablet 0  . ONETOUCH VERIO test strip 1 each 4 (four) times daily.     No facility-administered medications prior to visit.     Per HPI unless specifically indicated in ROS section below Review of Systems Objective:  BP 124/82 (BP Location: Left Arm, Patient Position: Sitting, Cuff Size: Large)   Pulse (!) 104   Temp (!) 97.5 F (36.4 C) (Temporal)   Ht 6' 4" (1.93 m)   Wt 265 lb 5 oz (120.3 kg)   SpO2 97%   BMI 32.29 kg/m   Wt Readings from Last 3 Encounters:  10/07/20 265 lb 5 oz (120.3 kg)  10/04/20 286 lb 9.6 oz (130 kg)  10/03/20 286 lb 9.6 oz (130 kg)      Physical Exam Vitals  and nursing note reviewed.  Constitutional:      Appearance: Normal appearance. He is obese. He is not ill-appearing.  Cardiovascular:     Rate and Rhythm: Normal rate and regular rhythm.     Pulses: Normal pulses.     Heart sounds: Normal heart sounds. No murmur heard.   Pulmonary:     Effort: Pulmonary effort is normal. No respiratory distress.     Breath sounds: Normal breath sounds. No wheezing, rhonchi or rales.  Musculoskeletal:     Right lower leg: No edema.     Left lower leg: No edema.  Skin:    General: Skin is warm and dry.     Findings: No rash.  Neurological:     Mental Status: He is alert.  Psychiatric:        Mood and Affect: Mood normal.        Behavior: Behavior normal.       Results for orders placed or performed during the  hospital encounter of 10/04/20  Basic metabolic panel  Result Value Ref Range   Sodium 127 (L) 135 - 145 mmol/L   Potassium 3.8 3.5 - 5.1 mmol/L   Chloride 89 (L) 98 - 111 mmol/L   CO2 25 22 - 32 mmol/L   Glucose, Bld 435 (H) 70 - 99 mg/dL   BUN 18 6 - 20 mg/dL   Creatinine, Ser 1.18 0.61 - 1.24 mg/dL   Calcium 9.9 8.9 - 10.3 mg/dL   GFR, Estimated >60 >60 mL/min   Anion gap 13 5 - 15  CBC  Result Value Ref Range   WBC 4.8 4.0 - 10.5 K/uL   RBC 4.91 4.22 - 5.81 MIL/uL   Hemoglobin 16.1 13.0 - 17.0 g/dL   HCT 42.1 39.0 - 52.0 %   MCV 85.7 80.0 - 100.0 fL   MCH 32.8 26.0 - 34.0 pg   MCHC 38.2 (H) 30.0 - 36.0 g/dL   RDW 11.4 (L) 11.5 - 15.5 %   Platelets 217 150 - 400 K/uL   nRBC 0.0 0.0 - 0.2 %  Urinalysis, Complete w Microscopic  Result Value Ref Range   Color, Urine YELLOW (A) YELLOW   APPearance CLEAR (A) CLEAR   Specific Gravity, Urine 1.022 1.005 - 1.030   pH 5.0 5.0 - 8.0   Glucose, UA >=500 (A) NEGATIVE mg/dL   Hgb urine dipstick NEGATIVE NEGATIVE   Bilirubin Urine NEGATIVE NEGATIVE   Ketones, ur 20 (A) NEGATIVE mg/dL   Protein, ur NEGATIVE NEGATIVE mg/dL   Nitrite NEGATIVE NEGATIVE   Leukocytes,Ua NEGATIVE NEGATIVE   RBC / HPF 0-5 0 - 5 RBC/hpf   WBC, UA 0-5 0 - 5 WBC/hpf   Bacteria, UA NONE SEEN NONE SEEN   Squamous Epithelial / LPF NONE SEEN 0 - 5  Hemoglobin A1c  Result Value Ref Range   Hgb A1c MFr Bld 9.6 (H) 4.8 - 5.6 %   Mean Plasma Glucose 228.82 mg/dL  CBG monitoring, ED  Result Value Ref Range   Glucose-Capillary 433 (H) 70 - 99 mg/dL   Comment 1 Notify RN    Comment 2 Document in Chart   CBG monitoring, ED  Result Value Ref Range   Glucose-Capillary 353 (H) 70 - 99 mg/dL   Lab Results  Component Value Date   CHOL 238 (H) 08/09/2018   HDL 35 (L) 08/09/2018   LDLCALC 149 (H) 08/09/2018   LDLDIRECT 158.0 11/21/2015   TRIG 270 (H) 08/09/2018   CHOLHDL 6.8 08/09/2018      Assessment & Plan:  This visit occurred during the SARS-CoV-2 public  health emergency.  Safety protocols were in place, including screening questions prior to the visit, additional usage of staff PPE, and extensive cleaning of exam room while observing appropriate contact time as indicated for disinfecting solutions.   Problem List Items Addressed This Visit    Obesity, Class I, BMI 30-34.9    Weight loss noted - anticipate due to hyperglycemia       Relevant Orders   Ambulatory referral to diabetic education   HTN (hypertension)    Stable period - continue current regimen.       Dyslipidemia associated with type 2 diabetes mellitus ([place])    Already on statin - continue. Due for rpt FLP.  LDL goal <70 given CVA hx.       Relevant Medications   Insulin Glargine (BASAGLAR KWIKPEN) 100 UNIT/ML   Other Relevant Orders   Ambulatory referral to diabetic education   Diabetes mellitus type 2, uncontrolled, with complications ([place]) - Primary    New diagnosis.  Reviewed pathophysiology of type 2 diabetes along with management recommendations.  Refer to DSME.  Has started metformin. Start basaglar 10u with quick titration to 15u and up to 20u if needed. Reviewed correct insulin administration, discussed talk with pharmacy or schedule nurse visit for further insulin teaching as necessary.  RTC 6 wks f/u visit.       Relevant Medications   Insulin Glargine (BASAGLAR KWIKPEN) 100 UNIT/ML   Other Relevant Orders   Ambulatory referral to diabetic education   Daytime somnolence    Has not yet completed HST - # provided to call and schedule.        Other Visit Diagnoses    Need for influenza vaccination       Relevant Orders   Flu Vaccine QUAD 36+ mos IM (Completed)       Meds ordered this encounter  Medications  . Insulin Glargine (BASAGLAR KWIKPEN) 100 UNIT/ML    Sig: Inject 15 Units into the skin daily.    Dispense:  15 mL    Refill:  0  . Insulin Pen Needle 32G X 6 MM MISC    Sig: Use as directed for insulin shots    Dispense:  100 each     Refill:  1   Orders Placed This Encounter  Procedures  . Flu Vaccine QUAD 36+ mos IM  . Ambulatory referral to diabetic education    Referral Priority:   Routine    Referral Type:   Consultation    Referral Reason:   Specialty Services Required    Number of Visits Requested:   1    Patient instructions: Flu shot today.  Call [facility] [phone] to ask about home sleep study.  Continue metformin 518m twice daily.  Start basaglar (long acting insulin) once daily start at 10 units daily for 1 week then increase to 15 units daily. If morning sugars staying consistently >200, then may increase up to 20 units daily.  Continue working on low sugar low carb diet.  We will refer you to diabetes education classes.  Return as needed or in 4-6 wks for follow up diabetes visit.   Check sugars either right before a meal or 2 hours after a meal. Goal sugars are 80-120 for fasting readings, <180 for mealtime readings.   Follow up plan: Return in about 4 weeks (around 11/04/2020) for follow up visit.  JRia Bush MD

## 2020-10-08 DIAGNOSIS — E1169 Type 2 diabetes mellitus with other specified complication: Secondary | ICD-10-CM | POA: Insufficient documentation

## 2020-10-08 NOTE — Assessment & Plan Note (Addendum)
New diagnosis.  Reviewed pathophysiology of type 2 diabetes along with management recommendations.  Refer to DSME.  Has started metformin. Start basaglar 10u with quick titration to 15u and up to 20u if needed. Reviewed correct insulin administration, discussed talk with pharmacy or schedule nurse visit for further insulin teaching as necessary.  RTC 6 wks f/u visit.

## 2020-10-08 NOTE — Assessment & Plan Note (Signed)
Stable period - continue current regimen.  

## 2020-10-08 NOTE — Assessment & Plan Note (Addendum)
Already on statin - continue. Due for rpt FLP.  LDL goal <70 given CVA hx.

## 2020-10-08 NOTE — Assessment & Plan Note (Signed)
Weight loss noted - anticipate due to hyperglycemia

## 2020-10-08 NOTE — Assessment & Plan Note (Signed)
Has not yet completed HST - # provided to call and schedule.

## 2020-11-02 ENCOUNTER — Encounter: Payer: Self-pay | Admitting: Family Medicine

## 2020-11-04 MED ORDER — METFORMIN HCL 500 MG PO TABS: 500.0000 mg | ORAL_TABLET | Freq: Two times a day (BID) | ORAL | 1 refills | Status: DC

## 2020-11-04 NOTE — Telephone Encounter (Signed)
Jeffery Banks- plz call pt this afternoon to f/u on COVID symptoms

## 2020-11-04 NOTE — Telephone Encounter (Addendum)
Mandy - we referred pt to ER last month for hyperglycemia with cbg 800s. He left without being seen the first day but returned subsequently and had IV fluids and insulin treatment. His insurance has denied ER visit coverage saying it was not emergency. Is there anything we can do about this? Thanks.

## 2020-11-04 NOTE — Addendum Note (Signed)
Addended by: Eustaquio Boyden on: 11/04/2020 07:36 AM   Modules accepted: Orders

## 2020-11-04 NOTE — Telephone Encounter (Signed)
Noted  

## 2020-11-08 NOTE — Telephone Encounter (Signed)
Lvm asking pt to call back.  Need an update on sxs and start date.

## 2020-11-11 NOTE — Telephone Encounter (Signed)
Spoke with pt asking for an update on sxs.  States he feels almost 100% better except still has a little chest congestion, which is improving.

## 2020-11-14 NOTE — Telephone Encounter (Signed)
Dr. Reece Agar, I will forward this to billing to review but as this charge is from the hospital, he will likely have to contact them regarding any review into this.  I am not sure how much we can do but will see.  Thanks.

## 2020-11-22 LAB — HM DIABETES EYE EXAM

## 2020-11-24 ENCOUNTER — Encounter: Payer: Self-pay | Admitting: Family Medicine

## 2021-01-26 ENCOUNTER — Telehealth: Payer: Self-pay

## 2021-01-26 NOTE — Telephone Encounter (Signed)
Received faxed Physician Collaboration Metrics Request form from [facility], RN of [place].    Form in basket on Lisa's desk.  Pt has OV tomorrow at 2:30.

## 2021-01-27 ENCOUNTER — Encounter: Payer: Self-pay | Admitting: Family Medicine

## 2021-01-27 ENCOUNTER — Ambulatory Visit (INDEPENDENT_AMBULATORY_CARE_PROVIDER_SITE_OTHER): Payer: BC Managed Care – PPO | Admitting: Family Medicine

## 2021-01-27 ENCOUNTER — Other Ambulatory Visit: Payer: Self-pay

## 2021-01-27 VITALS — BP 120/88 | HR 88 | Temp 97.4°F | Ht 76.0 in | Wt 258.2 lb

## 2021-01-27 DIAGNOSIS — E1165 Type 2 diabetes mellitus with hyperglycemia: Secondary | ICD-10-CM | POA: Diagnosis not present

## 2021-01-27 DIAGNOSIS — B351 Tinea unguium: Secondary | ICD-10-CM

## 2021-01-27 DIAGNOSIS — E118 Type 2 diabetes mellitus with unspecified complications: Secondary | ICD-10-CM

## 2021-01-27 DIAGNOSIS — G459 Transient cerebral ischemic attack, unspecified: Secondary | ICD-10-CM | POA: Diagnosis not present

## 2021-01-27 DIAGNOSIS — E785 Hyperlipidemia, unspecified: Secondary | ICD-10-CM

## 2021-01-27 DIAGNOSIS — E1169 Type 2 diabetes mellitus with other specified complication: Secondary | ICD-10-CM | POA: Diagnosis not present

## 2021-01-27 DIAGNOSIS — IMO0002 Reserved for concepts with insufficient information to code with codable children: Secondary | ICD-10-CM

## 2021-01-27 LAB — POCT GLYCOSYLATED HEMOGLOBIN (HGB A1C): Hemoglobin A1C: 5.6 % (ref 4.0–5.6)

## 2021-01-27 MED ORDER — TERBINAFINE HCL 250 MG PO TABS: 250.0000 mg | ORAL_TABLET | Freq: Every day | ORAL | 0 refills | Status: DC

## 2021-01-27 NOTE — Patient Instructions (Addendum)
Congratulations on healthy changes to date Stay off insulin, continue other medicines Look into OTC funginail. We will check liver function and if normal, will try oral pill for nails.  Return as needed or in 3 months for physical.

## 2021-01-27 NOTE — Assessment & Plan Note (Signed)
Congratulated on marked improvement in control - he has been able to taper off insulin. Will remain off at this time. Continue metformin BID, continue healthy diet and lifestyle changes to date.  Did not attend DSME, declines at this time.

## 2021-01-27 NOTE — Telephone Encounter (Signed)
Filled and returned to Lisa 

## 2021-01-27 NOTE — Assessment & Plan Note (Addendum)
Check LFTs and if normal, Rx terbinafine

## 2021-01-27 NOTE — Assessment & Plan Note (Addendum)
Update FLP on statin.  The 10-year ASCVD risk score Denman George DC Montez Hageman., et al., 2013) is: 9.9%   Values used to calculate the score:     Age: 50 years     Sex: Male     Is Non-Hispanic African American: No     Diabetic: Yes     Tobacco smoker: No     Systolic Blood Pressure: 120 mmHg     Is BP treated: Yes     HDL Cholesterol: 35 mg/dL     Total Cholesterol: 238 mg/dL

## 2021-01-27 NOTE — Assessment & Plan Note (Addendum)
Continue aspirin and statin. 

## 2021-01-27 NOTE — Progress Notes (Addendum)
Patient ID: Jeffery Banks, male    DOB: 10/04/1971, 50 y.o.   MRN: [id number]  This visit was conducted in person.  BP 120/88   Pulse 88   Temp (!) 97.4 F (36.3 C) (Temporal)   Ht 6' 4"  (1.93 m)   Wt 258 lb 4 oz (117.1 kg)   SpO2 97%   BMI 31.44 kg/m    CC: DM f/u visit  Subjective:   HPI: Jeffery Banks is a 50 y.o. male presenting on 01/27/2021 for Diabetes (Here for f/u.  Also, needs form from [place] completed. )   See prior note for details. Seen at ER then here for new diagnosis diabetes with A1c 9.6%, presenting cbg of 800s. ER treated with insulin and IVF. Started on metformin 5103m bid. We also started basaglar 15u daily.   Overall doing well. 7 lb weight loss since last visit. Has changed diet and lifestyle - walking regularly, eating healthier, eliminated all sodas. Feels these changes are sustainable.   DM - does regularly check sugars 90-125 throughout the day. Compliant with antihyperglycemic regimen which includes: basaglar 10u daily, metformin 5043mbid. Ran out of basaglar over weekend. Denies low sugars or hypoglycemic symptoms. Denies paresthesias. Last diabetic eye exam 11/2020. Glucometer brand: Freestyle Libre CGM. DSME: did not complete.  Lab Results  Component Value Date   HGBA1C 5.6 01/27/2021   Diabetic Foot Exam - Simple   Simple Foot Form Diabetic Foot exam was performed with the following findings: Yes 01/27/2021  2:27 PM  Visual Inspection See comments: Yes Sensation Testing Intact to touch and monofilament testing bilaterally: Yes Pulse Check Posterior Tibialis and Dorsalis pulse intact bilaterally: Yes Comments Thickened onychomycotic nails of several toes bilaterally    Lab Results  Component Value Date   MICROALBUR 0.7 04/15/2017        Relevant past medical, surgical, family and social history reviewed and updated as indicated. Interim medical history since our last visit reviewed. Allergies and medications reviewed and  updated. Outpatient Medications Prior to Visit  Medication Sig Dispense Refill  . aspirin EC 81 MG tablet Take 1 tablet (81 mg total) by mouth daily.    . Marland Kitchentorvastatin (LIPITOR) 40 MG tablet Take 1 tablet (40 mg total) by mouth daily at 6 PM. 90 tablet 3  . Insulin Pen Needle 32G X 6 MM MISC Use as directed for insulin shots 100 each 1  . lisinopril-hydrochlorothiazide (ZESTORETIC) 20-12.5 MG tablet Take 1 tablet by mouth daily. 90 tablet 3  . metFORMIN (GLUCOPHAGE) 500 MG tablet Take 1 tablet (500 mg total) by mouth 2 (two) times daily with a meal. 180 tablet 1  . Insulin Glargine (BASAGLAR KWIKPEN) 100 UNIT/ML Inject 15 Units into the skin daily. 15 mL 0  . blood glucose meter kit and supplies KIT Dispense based on patient and insurance preference. Use up to four times daily as directed. (FOR ICD-9 250.00, 250.01). 1 each 0  . Lancets (ONETOUCH DELICA PLUS [id number] Apply 1 each topically 4 (four) times daily.    . ondansetron (ZOFRAN ODT) 4 MG disintegrating tablet Take 1 tablet (4 mg total) by mouth every 8 (eight) hours as needed for nausea or vomiting. 20 tablet 0  . ONETOUCH VERIO test strip 1 each 4 (four) times daily.     No facility-administered medications prior to visit.     Per HPI unless specifically indicated in ROS section below Review of Systems Objective:  BP 120/88   Pulse 88  Temp (!) 97.4 F (36.3 C) (Temporal)   Ht 6' 4"  (1.93 m)   Wt 258 lb 4 oz (117.1 kg)   SpO2 97%   BMI 31.44 kg/m   Wt Readings from Last 3 Encounters:  01/27/21 258 lb 4 oz (117.1 kg)  10/07/20 265 lb 5 oz (120.3 kg)  10/04/20 286 lb 9.6 oz (130 kg)      Physical Exam Vitals and nursing note reviewed.  Constitutional:      General: He is not in acute distress.    Appearance: Normal appearance. He is well-developed. He is not ill-appearing.  Eyes:     General: No scleral icterus.    Extraocular Movements: Extraocular movements intact.     Conjunctiva/sclera: Conjunctivae  normal.     Pupils: Pupils are equal, round, and reactive to light.  Cardiovascular:     Rate and Rhythm: Normal rate and regular rhythm.     Pulses: Normal pulses.     Heart sounds: Normal heart sounds. No murmur heard.   Pulmonary:     Effort: Pulmonary effort is normal. No respiratory distress.     Breath sounds: Normal breath sounds. No wheezing, rhonchi or rales.  Musculoskeletal:     Cervical back: Normal range of motion and neck supple.     Comments: See HPI for foot exam if done  Lymphadenopathy:     Cervical: No cervical adenopathy.  Skin:    General: Skin is warm and dry.     Findings: No rash.  Neurological:     Mental Status: He is alert.  Psychiatric:        Mood and Affect: Mood normal.        Behavior: Behavior normal.       Results for orders placed or performed in visit on 01/27/21  POCT glycosylated hemoglobin (Hb A1C)  Result Value Ref Range   Hemoglobin A1C 5.6 4.0 - 5.6 %   HbA1c POC (<> result, manual entry)     HbA1c, POC (prediabetic range)     HbA1c, POC (controlled diabetic range)     Assessment & Plan:  This visit occurred during the SARS-CoV-2 public health emergency.  Safety protocols were in place, including screening questions prior to the visit, additional usage of staff PPE, and extensive cleaning of exam room while observing appropriate contact time as indicated for disinfecting solutions.   Problem List Items Addressed This Visit    Dyslipidemia associated with type 2 diabetes mellitus ([place])    Update FLP on statin.  The 10-year ASCVD risk score Mikey Bussing DC Brooke Bonito., et al., 2013) is: 9.9%   Values used to calculate the score:     Age: 61 years     Sex: Male     Is Non-Hispanic African American: No     Diabetic: Yes     Tobacco smoker: No     Systolic Blood Pressure: 768 mmHg     Is BP treated: Yes     HDL Cholesterol: 35 mg/dL     Total Cholesterol: 238 mg/dL       TIA (transient ischemic attack)    Continue aspirin and statin       Diabetes mellitus type 2, uncontrolled, with complications ([place]) - Primary    Congratulated on marked improvement in control - he has been able to taper off insulin. Will remain off at this time. Continue metformin BID, continue healthy diet and lifestyle changes to date.  Did not attend DSME, declines at this time.  Relevant Orders   POCT glycosylated hemoglobin (Hb A1C) (Completed)   Lipid panel   Comprehensive metabolic panel   Onychomycosis    Check LFTs and if normal, Rx terbinafine          Meds ordered this encounter  Medications  . DISCONTD: terbinafine (LAMISIL) 250 MG tablet    Sig: Take 1 tablet (250 mg total) by mouth daily.    Dispense:  45 tablet    Refill:  0   Orders Placed This Encounter  Procedures  . Lipid panel  . Comprehensive metabolic panel  . POCT glycosylated hemoglobin (Hb A1C)    Patient Instructions  Congratulations on healthy changes to date Stay off insulin, continue other medicines Look into OTC funginail. We will check liver function and if normal, will try oral pill for nails.  Return as needed or in 3 months for physical.   Follow up plan: Return in about 3 months (around 04/28/2021) for annual exam, prior fasting for blood work.  Ria Bush, MD

## 2021-01-27 NOTE — Telephone Encounter (Signed)
Faxed form.

## 2021-01-27 NOTE — Addendum Note (Signed)
Addended by: Aquilla Solian on: 01/27/2021 03:15 PM   Modules accepted: Orders

## 2021-01-28 LAB — COMPREHENSIVE METABOLIC PANEL
AG Ratio: 1.9 (calc) (ref 1.0–2.5)
ALT: 44 U/L (ref 9–46)
AST: 33 U/L (ref 10–40)
Albumin: 4.8 g/dL (ref 3.6–5.1)
Alkaline phosphatase (APISO): 64 U/L (ref 36–130)
BUN: 15 mg/dL (ref 7–25)
CO2: 27 mmol/L (ref 20–32)
Calcium: 9.9 mg/dL (ref 8.6–10.3)
Chloride: 102 mmol/L (ref 98–110)
Creat: 1.12 mg/dL (ref 0.60–1.35)
Globulin: 2.5 g/dL (calc) (ref 1.9–3.7)
Glucose, Bld: 87 mg/dL (ref 65–99)
Potassium: 4.2 mmol/L (ref 3.5–5.3)
Sodium: 141 mmol/L (ref 135–146)
Total Bilirubin: 0.5 mg/dL (ref 0.2–1.2)
Total Protein: 7.3 g/dL (ref 6.1–8.1)

## 2021-01-28 LAB — LIPID PANEL
Cholesterol: 145 mg/dL (ref <200)
HDL: 56 mg/dL (ref >=40)
LDL Cholesterol (Calc): 64 mg/dL (calc)
Non-HDL Cholesterol (Calc): 89 mg/dL (calc) (ref <130)
Total CHOL/HDL Ratio: 2.6 (calc) (ref <5.0)
Triglycerides: 171 mg/dL — ABNORMAL HIGH (ref <150)

## 2021-02-07 ENCOUNTER — Telehealth: Payer: Self-pay | Admitting: Family Medicine

## 2021-02-07 MED ORDER — TERBINAFINE HCL 250 MG PO TABS: 250.0000 mg | ORAL_TABLET | Freq: Every day | ORAL | 0 refills | Status: DC

## 2021-02-07 NOTE — Telephone Encounter (Signed)
Plz notify I've sent in terbinafine (lamisil) antifungal for him to try if desired - at pharmacy.

## 2021-02-08 NOTE — Telephone Encounter (Signed)
Detailed message left on voicemail (per DPR) that script has been sent in per Dr. Sharen Hones.

## 2021-03-18 ENCOUNTER — Telehealth: Payer: Self-pay | Admitting: Family Medicine

## 2021-03-20 ENCOUNTER — Encounter: Payer: Self-pay | Admitting: Family Medicine

## 2021-03-21 NOTE — Telephone Encounter (Signed)
E-scribed refills.  Plz schedule lab and cpe visits after 04/28/21.

## 2021-03-21 NOTE — Telephone Encounter (Signed)
Noted  

## 2021-03-21 NOTE — Telephone Encounter (Signed)
Patient is scheduled EM 

## 2021-05-06 ENCOUNTER — Other Ambulatory Visit: Payer: Self-pay | Admitting: Family Medicine

## 2021-05-17 ENCOUNTER — Other Ambulatory Visit: Payer: Self-pay | Admitting: Family Medicine

## 2021-05-17 DIAGNOSIS — Z1159 Encounter for screening for other viral diseases: Secondary | ICD-10-CM

## 2021-05-17 DIAGNOSIS — E1169 Type 2 diabetes mellitus with other specified complication: Secondary | ICD-10-CM

## 2021-05-17 DIAGNOSIS — IMO0002 Reserved for concepts with insufficient information to code with codable children: Secondary | ICD-10-CM

## 2021-05-17 DIAGNOSIS — E1165 Type 2 diabetes mellitus with hyperglycemia: Secondary | ICD-10-CM

## 2021-05-19 ENCOUNTER — Other Ambulatory Visit: Payer: BC Managed Care – PPO

## 2021-05-26 ENCOUNTER — Encounter: Payer: BC Managed Care – PPO | Admitting: Family Medicine

## 2021-06-17 ENCOUNTER — Other Ambulatory Visit: Payer: Self-pay | Admitting: Family Medicine

## 2021-06-20 NOTE — Telephone Encounter (Signed)
Called pt to schedule appt. Pt did not answer so I left a VM.

## 2021-06-20 NOTE — Telephone Encounter (Signed)
E-scribed refills.  Plz schedule lab and cpe visits to prevent delay in future refills.  

## 2021-08-16 ENCOUNTER — Other Ambulatory Visit: Payer: Self-pay

## 2021-08-16 ENCOUNTER — Encounter: Payer: Self-pay | Admitting: Family Medicine

## 2021-08-16 ENCOUNTER — Ambulatory Visit (INDEPENDENT_AMBULATORY_CARE_PROVIDER_SITE_OTHER): Payer: BC Managed Care – PPO | Admitting: Family Medicine

## 2021-08-16 ENCOUNTER — Ambulatory Visit (INDEPENDENT_AMBULATORY_CARE_PROVIDER_SITE_OTHER): Payer: BC Managed Care – PPO

## 2021-08-16 VITALS — BP 118/80 | HR 102 | Temp 97.1°F | Ht 76.0 in | Wt 252.0 lb

## 2021-08-16 DIAGNOSIS — Z23 Encounter for immunization: Secondary | ICD-10-CM

## 2021-08-16 DIAGNOSIS — E1169 Type 2 diabetes mellitus with other specified complication: Secondary | ICD-10-CM

## 2021-08-16 DIAGNOSIS — E669 Obesity, unspecified: Secondary | ICD-10-CM

## 2021-08-16 DIAGNOSIS — U099 Post covid-19 condition, unspecified: Secondary | ICD-10-CM

## 2021-08-16 DIAGNOSIS — R0609 Other forms of dyspnea: Secondary | ICD-10-CM

## 2021-08-16 LAB — BASIC METABOLIC PANEL
BUN: 16 mg/dL (ref 6–23)
CO2: 30 mEq/L (ref 19–32)
Calcium: 9.9 mg/dL (ref 8.4–10.5)
Chloride: 93 mEq/L — ABNORMAL LOW (ref 96–112)
Creatinine, Ser: 1.14 mg/dL (ref 0.40–1.50)
GFR: 76.13 mL/min (ref >60.00)
Glucose, Bld: 321 mg/dL — ABNORMAL HIGH (ref 70–99)
Potassium: 4.4 mEq/L (ref 3.5–5.1)
Sodium: 132 mEq/L — ABNORMAL LOW (ref 135–145)

## 2021-08-16 LAB — CBC WITH DIFFERENTIAL/PLATELET
Basophils Absolute: 0 10*3/uL (ref 0.0–0.1)
Basophils Relative: 0.6 % (ref 0.0–3.0)
Eosinophils Absolute: 0.1 10*3/uL (ref 0.0–0.7)
Eosinophils Relative: 3.3 % (ref 0.0–5.0)
HCT: 43.3 % (ref 39.0–52.0)
Hemoglobin: 15.4 g/dL (ref 13.0–17.0)
Lymphocytes Relative: 39.5 % (ref 12.0–46.0)
Lymphs Abs: 1.4 10*3/uL (ref 0.7–4.0)
MCHC: 35.6 g/dL (ref 30.0–36.0)
MCV: 91.4 fl (ref 78.0–100.0)
Monocytes Absolute: 0.3 10*3/uL (ref 0.1–1.0)
Monocytes Relative: 7.7 % (ref 3.0–12.0)
Neutro Abs: 1.7 10*3/uL (ref 1.4–7.7)
Neutrophils Relative %: 48.9 % (ref 43.0–77.0)
Platelets: 175 10*3/uL (ref 150.0–400.0)
RBC: 4.74 Mil/uL (ref 4.22–5.81)
RDW: 13.2 % (ref 11.5–15.5)
WBC: 3.4 10*3/uL — ABNORMAL LOW (ref 4.0–10.5)

## 2021-08-16 LAB — BRAIN NATRIURETIC PEPTIDE: Pro B Natriuretic peptide (BNP): 3 pg/mL (ref 0.0–100.0)

## 2021-08-16 LAB — HEMOGLOBIN A1C: Hgb A1c MFr Bld: 10.4 % — ABNORMAL HIGH (ref 4.6–6.5)

## 2021-08-16 MED ORDER — PULMICORT FLEXHALER 180 MCG/ACT IN AEPB: 2.0000 | INHALATION_SPRAY | Freq: Two times a day (BID) | RESPIRATORY_TRACT | 0 refills | Status: DC

## 2021-08-16 NOTE — Progress Notes (Addendum)
Patient ID: Jeffery Banks, male    DOB: [birth year], 50 y.o.   MRN: [id number]  This visit was conducted in person.  BP 118/80 (BP Location: Left Arm, Patient Position: Sitting, Cuff Size: Normal)   Pulse (!) 102   Temp (!) 97.1 F (36.2 C) (Temporal)   Ht 6\' 4"  (1.93 m)   Wt 252 lb (114.3 kg)   SpO2 94%   BMI 30.67 kg/m   Ambulatory pulse ox - starts at 95%, drops to 94%.   CC: post COVID symptoms Subjective:   HPI: Jeffery Banks is a 50 y.o. male presenting on 08/16/2021 for Acute Visit (SOB and wheezing upon exertion since having COVID back in early september)   COVID illness beginning of 06/2021. Presented with fatigue, chills, body aches (legs), dyspnea, cough, ST.  Didn't seek care or take medication for this.  No abd pain, nausea, diarrhea, loss of taste/smell.   Persistent exertional dyspnea, worse in the mornings. Persistent congestion and cough productive of clear mucous and fatigue. Activity limiting. Some mental fogginess.  No leg swelling, no orthopnea or chest pain or dizziness, palpitations.  Increased cramping noted at night time.   COVID vaccine - Moderna 02/2020, 03/2020.   No h/o asthma.  Ex smoker - quit 2012, 7 PY hx.  Working on weight loss with regular walking - nadir 245 lbs, less able due to above.  Sugars elevated recently - >200-250.  Lab Results  Component Value Date   HGBA1C 5.6 01/27/2021  Continues metformin 500mg  bid.      Relevant past medical, surgical, family and social history reviewed and updated as indicated. Interim medical history since our last visit reviewed. Allergies and medications reviewed and updated. Outpatient Medications Prior to Visit  Medication Sig Dispense Refill   aspirin EC 81 MG tablet Take 1 tablet (81 mg total) by mouth daily.     atorvastatin (LIPITOR) 40 MG tablet TAKE 1 TABLET BY MOUTH DAILY AT 6 PM. 90 tablet 0   Continuous Blood Gluc Sensor (FREESTYLE LIBRE 14 DAY SENSOR) MISC by Does not apply  route.     Insulin Pen Needle 32G X 6 MM MISC Use as directed for insulin shots 100 each 1   lisinopril-hydrochlorothiazide (ZESTORETIC) 20-12.5 MG tablet TAKE 1 TABLET BY MOUTH EVERY DAY 90 tablet 0   metFORMIN (GLUCOPHAGE) 500 MG tablet TAKE 1 TABLET BY MOUTH 2 TIMES DAILY WITH A MEAL. 180 tablet 1   terbinafine (LAMISIL) 250 MG tablet Take 1 tablet (250 mg total) by mouth daily. (Patient not taking: Reported on 08/16/2021) 45 tablet 0   No facility-administered medications prior to visit.     Per HPI unless specifically indicated in ROS section below Review of Systems  Objective:  BP 118/80 (BP Location: Left Arm, Patient Position: Sitting, Cuff Size: Normal)   Pulse (!) 102   Temp (!) 97.1 F (36.2 C) (Temporal)   Ht 6\' 4"  (1.93 m)   Wt 252 lb (114.3 kg)   SpO2 94%   BMI 30.67 kg/m   Wt Readings from Last 3 Encounters:  08/16/21 252 lb (114.3 kg)  01/27/21 258 lb 4 oz (117.1 kg)  10/07/20 265 lb 5 oz (120.3 kg)      Physical Exam Vitals and nursing note reviewed.  Constitutional:      Appearance: Normal appearance. He is not ill-appearing.  HENT:     Right Ear: Tympanic membrane, ear canal and external ear normal. There is no impacted cerumen.  Left Ear: Tympanic membrane, ear canal and external ear normal. There is no impacted cerumen.  Eyes:     General:        Right eye: No discharge.        Left eye: No discharge.     Extraocular Movements: Extraocular movements intact.     Conjunctiva/sclera: Conjunctivae normal.     Pupils: Pupils are equal, round, and reactive to light.  Cardiovascular:     Rate and Rhythm: Normal rate and regular rhythm.     Pulses: Normal pulses.     Heart sounds: Normal heart sounds. No murmur heard. Pulmonary:     Effort: Pulmonary effort is normal. No respiratory distress.     Breath sounds: No stridor. Wheezing (faint exp wheezing R lung) present. No rales.  Musculoskeletal:     Right lower leg: No edema.     Left lower leg: No  edema.  Skin:    General: Skin is warm and dry.  Neurological:     Mental Status: He is alert.  Psychiatric:        Mood and Affect: Mood normal.        Behavior: Behavior normal.      Results for orders placed or performed in visit on 01/27/21  Comprehensive metabolic panel  Result Value Ref Range   Glucose, Bld 87 65 - 99 mg/dL   BUN 15 7 - 25 mg/dL   Creat 3.32 9.51 - 8.84 mg/dL   BUN/Creatinine Ratio NOT APPLICABLE 6 - 22 (calc)   Sodium 141 135 - 146 mmol/L   Potassium 4.2 3.5 - 5.3 mmol/L   Chloride 102 98 - 110 mmol/L   CO2 27 20 - 32 mmol/L   Calcium 9.9 8.6 - 10.3 mg/dL   Total Protein 7.3 6.1 - 8.1 g/dL   Albumin 4.8 3.6 - 5.1 g/dL   Globulin 2.5 1.9 - 3.7 g/dL (calc)   AG Ratio 1.9 1.0 - 2.5 (calc)   Total Bilirubin 0.5 0.2 - 1.2 mg/dL   Alkaline phosphatase (APISO) 64 36 - 130 U/L   AST 33 10 - 40 U/L   ALT 44 9 - 46 U/L  Lipid panel  Result Value Ref Range   Cholesterol 145 <200 mg/dL   HDL 56 > OR = 40 mg/dL   Triglycerides 166 (H) <150 mg/dL   LDL Cholesterol (Calc) 64 mg/dL (calc)   Total CHOL/HDL Ratio 2.6 <5.0 (calc)   Non-HDL Cholesterol (Calc) 89 <063 mg/dL (calc)  POCT glycosylated hemoglobin (Hb A1C)  Result Value Ref Range   Hemoglobin A1C 5.6 4.0 - 5.6 %   HbA1c POC (<> result, manual entry)     HbA1c, POC (prediabetic range)     HbA1c, POC (controlled diabetic range)      Assessment & Plan:  This visit occurred during the SARS-CoV-2 public health emergency.  Safety protocols were in place, including screening questions prior to the visit, additional usage of staff PPE, and extensive cleaning of exam room while observing appropriate contact time as indicated for disinfecting solutions.   Problem List Items Addressed This Visit     Obesity, Class I, BMI 30-34.9   Type 2 diabetes mellitus with other specified complication (HCC)    Notes worsened control over the past few weeks despite metformin 500mg  TID.  Update A1c. Anticipate may need  additional medication.  Avoid oral prednisone for now.       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 Check labs today (CBC, BMP, BNP), ambulatory pulse ox, CXR. Start pulmicort inhaler for the next 3-4 wks. Update with effect, consider oral prednisone and/or pulm eval. Pt agrees with plan.       Relevant Orders   CBC with Differential/Platelet   Hemoglobin A1c   Basic metabolic panel   DG Chest 2 View   Other Visit Diagnoses     Dyspnea on exertion       Relevant Orders   Brain natriuretic peptide   Need for immunization against influenza       Relevant Orders   Flu Vaccine QUAD 71mo+IM (Fluarix, Fluzone & Alfiuria Quad PF) (Completed)        Meds ordered this encounter  Medications   budesonide (PULMICORT FLEXHALER) 180 MCG/ACT inhaler    Sig: Inhale 2 puffs into the lungs in the morning and at bedtime.    Dispense:  1 each    Refill:  0    Orders Placed This Encounter  Procedures   DG Chest 2 View    Standing Status:   Future    Number of Occurrences:   1    Standing Expiration Date:   08/16/2022    Order Specific Question:   Reason for Exam (SYMPTOM  OR DIAGNOSIS REQUIRED)    Answer:   persistent dyspnea after COVID 2 months ago    Order Specific Question:   Preferred imaging location?    Answer:   [facility]   Flu Vaccine QUAD 23mo+IM (Fluarix, Fluzone & Alfiuria Quad PF)   CBC with Differential/Platelet   Hemoglobin A1c   Basic metabolic panel   Brain natriuretic peptide     Patient Instructions  Flu shot today  Ambulatory pulse ox today. Go to [facility] basement for chest xray this week. You can walk in for this.  Labs today, including A1c today.  Start inhaled steroid sent to pharmacy (pulmicort).  Update [place] with how you're doing with inhaler. If not improving, we may refer you to the lung doctor.   Follow up  plan: Return if symptoms worsen or fail to improve.  Eustaquio Boyden, MD

## 2021-08-16 NOTE — Assessment & Plan Note (Signed)
COVID infection 7+ wks ago, now with persistent exertional dyspnea, cough, fatigue anticipate all post-COVID sequelae. Check labs today (CBC, BMP, BNP), ambulatory pulse ox, CXR. Start pulmicort inhaler for the next 3-4 wks. Update with effect, consider oral prednisone and/or pulm eval. Pt agrees with plan.

## 2021-08-16 NOTE — Assessment & Plan Note (Signed)
Notes worsened control over the past few weeks despite metformin 500mg  TID.  Update A1c. Anticipate may need additional medication.  Avoid oral prednisone for now.

## 2021-08-16 NOTE — Patient Instructions (Addendum)
Flu shot today  Ambulatory pulse ox today. Go to [facility] basement for chest xray this week. You can walk in for this.  Labs today, including A1c today.  Start inhaled steroid sent to pharmacy (pulmicort).  Update [place] with how you're doing with inhaler. If not improving, we may refer you to the lung doctor.

## 2021-08-17 ENCOUNTER — Encounter: Payer: Self-pay | Admitting: Family Medicine

## 2021-08-17 ENCOUNTER — Other Ambulatory Visit: Payer: Self-pay | Admitting: Family Medicine

## 2021-08-17 MED ORDER — GLIMEPIRIDE 2 MG PO TABS: 2.0000 mg | ORAL_TABLET | Freq: Every day | ORAL | 6 refills | Status: DC

## 2021-08-18 MED ORDER — OZEMPIC (0.25 OR 0.5 MG/DOSE) 2 MG/1.5ML ~~LOC~~ SOPN: PEN_INJECTOR | SUBCUTANEOUS | 3 refills | Status: DC

## 2021-08-21 MED ORDER — METFORMIN HCL 500 MG PO TABS: 500.0000 mg | ORAL_TABLET | Freq: Two times a day (BID) | ORAL | 1 refills | Status: DC

## 2021-08-21 NOTE — Addendum Note (Signed)
Addended by: Eustaquio Boyden on: 08/21/2021 07:42 AM   Modules accepted: Orders

## 2021-09-16 ENCOUNTER — Other Ambulatory Visit: Payer: Self-pay | Admitting: Family Medicine

## 2021-10-31 ENCOUNTER — Encounter: Payer: Self-pay | Admitting: Family Medicine

## 2021-12-14 ENCOUNTER — Other Ambulatory Visit: Payer: Self-pay | Admitting: Family Medicine

## 2021-12-14 NOTE — Telephone Encounter (Signed)
E-scribed refill.  Plz schedule lab and CPE visits for additional refills.  °

## 2021-12-18 ENCOUNTER — Other Ambulatory Visit: Payer: Self-pay | Admitting: Family Medicine

## 2022-01-20 ENCOUNTER — Other Ambulatory Visit: Payer: Self-pay | Admitting: Family Medicine

## 2022-01-23 NOTE — Telephone Encounter (Signed)
Left a message on voicemail for patient to call the office back. ?Need to confirm what dose of Ozempic he is currently taking. Patient needs to schedule a follow-up visit. See last lab results. ?

## 2022-01-25 NOTE — Telephone Encounter (Signed)
Lvm asking pt to call back.  Need to know what dose pt is taking now. ? ?Ozempic ?Last filled:  11/23/21, #1.5 mL ?Last OV:  08/16/21, [facility]; DM f/u ?Next OV:  none;  per 08/16/21 Lab Result Note, pt due for DM f/u around 11/16/21 ?

## 2022-01-25 NOTE — Telephone Encounter (Signed)
Refilled. Plz schedule physical.  ?

## 2022-01-26 NOTE — Telephone Encounter (Signed)
Left message on VM per DPR to set up CPE and Labs. ?

## 2022-01-29 NOTE — Telephone Encounter (Signed)
Left a detailed message (DPR) on voicemail to call the office back and schedule a physical ?

## 2022-02-12 ENCOUNTER — Other Ambulatory Visit: Payer: Self-pay | Admitting: Family Medicine

## 2022-03-14 ENCOUNTER — Other Ambulatory Visit: Payer: Self-pay | Admitting: Family Medicine

## 2022-03-14 ENCOUNTER — Other Ambulatory Visit: Payer: Self-pay | Admitting: Internal Medicine

## 2022-05-30 ENCOUNTER — Other Ambulatory Visit: Payer: Self-pay | Admitting: Family Medicine

## 2022-05-30 NOTE — Telephone Encounter (Signed)
E-scribed refills.   Plz schedule CPE and lab visits for additional refills.  

## 2022-05-30 NOTE — Telephone Encounter (Signed)
Called patient and left VM for him to call and schedule cpe and labs

## 2022-05-30 NOTE — Telephone Encounter (Signed)
Noted  

## 2022-08-20 ENCOUNTER — Other Ambulatory Visit: Payer: Self-pay | Admitting: Family Medicine

## 2022-08-24 ENCOUNTER — Other Ambulatory Visit: Payer: Self-pay | Admitting: Family Medicine

## 2022-08-24 DIAGNOSIS — I10 Essential (primary) hypertension: Secondary | ICD-10-CM

## 2022-08-24 NOTE — Telephone Encounter (Signed)
LVM for patient to call and schedule

## 2022-08-24 NOTE — Telephone Encounter (Signed)
Noted  

## 2022-08-24 NOTE — Telephone Encounter (Signed)
E-scribed refill.  Plz schedule CPE and lab visits for additional refills.  

## 2022-11-21 ENCOUNTER — Other Ambulatory Visit: Payer: Self-pay | Admitting: Family Medicine

## 2022-11-21 DIAGNOSIS — I10 Essential (primary) hypertension: Secondary | ICD-10-CM

## 2022-11-21 NOTE — Telephone Encounter (Signed)
Needs OV for additional refills. Last OV: 08/16/21, f/u. Last CPE: 04/15/17.   Requests denied.

## 2022-12-02 ENCOUNTER — Other Ambulatory Visit: Payer: Self-pay | Admitting: Family Medicine

## 2022-12-14 ENCOUNTER — Ambulatory Visit (INDEPENDENT_AMBULATORY_CARE_PROVIDER_SITE_OTHER): Payer: 59 | Admitting: Family Medicine

## 2022-12-14 ENCOUNTER — Encounter: Payer: Self-pay | Admitting: Family Medicine

## 2022-12-14 VITALS — BP 124/80 | HR 86 | Temp 97.8°F | Ht 76.0 in | Wt 260.5 lb

## 2022-12-14 DIAGNOSIS — I10 Essential (primary) hypertension: Secondary | ICD-10-CM | POA: Diagnosis not present

## 2022-12-14 DIAGNOSIS — E785 Hyperlipidemia, unspecified: Secondary | ICD-10-CM | POA: Diagnosis not present

## 2022-12-14 DIAGNOSIS — Z23 Encounter for immunization: Secondary | ICD-10-CM

## 2022-12-14 DIAGNOSIS — E1169 Type 2 diabetes mellitus with other specified complication: Secondary | ICD-10-CM | POA: Diagnosis not present

## 2022-12-14 LAB — COMPREHENSIVE METABOLIC PANEL
ALT: 136 U/L — ABNORMAL HIGH (ref 0–53)
AST: 86 U/L — ABNORMAL HIGH (ref 0–37)
Albumin: 4.8 g/dL (ref 3.5–5.2)
Alkaline Phosphatase: 73 U/L (ref 39–117)
BUN: 15 mg/dL (ref 6–23)
CO2: 28 mEq/L (ref 19–32)
Calcium: 10 mg/dL (ref 8.4–10.5)
Chloride: 101 mEq/L (ref 96–112)
Creatinine, Ser: 1.13 mg/dL (ref 0.40–1.50)
GFR: 76.23 mL/min (ref >60.00)
Glucose, Bld: 134 mg/dL — ABNORMAL HIGH (ref 70–99)
Potassium: 4 mEq/L (ref 3.5–5.1)
Sodium: 140 mEq/L (ref 135–145)
Total Bilirubin: 0.8 mg/dL (ref 0.2–1.2)
Total Protein: 7 g/dL (ref 6.0–8.3)

## 2022-12-14 LAB — LIPID PANEL
Cholesterol: 160 mg/dL (ref 0–200)
HDL: 35.4 mg/dL — ABNORMAL LOW (ref >39.00)
NonHDL: 125.04
Total CHOL/HDL Ratio: 5
Triglycerides: 244 mg/dL — ABNORMAL HIGH (ref 0.0–149.0)
VLDL: 48.8 mg/dL — ABNORMAL HIGH (ref 0.0–40.0)

## 2022-12-14 LAB — POCT GLYCOSYLATED HEMOGLOBIN (HGB A1C): Hemoglobin A1C: 6.5 % — AB (ref 4.0–5.6)

## 2022-12-14 LAB — LDL CHOLESTEROL, DIRECT: Direct LDL: 97 mg/dL

## 2022-12-14 MED ORDER — OZEMPIC (0.25 OR 0.5 MG/DOSE) 2 MG/3ML ~~LOC~~ SOPN: 0.5000 mg | PEN_INJECTOR | SUBCUTANEOUS | 11 refills | Status: DC

## 2022-12-14 MED ORDER — METFORMIN HCL 500 MG PO TABS
500.0000 mg | ORAL_TABLET | Freq: Two times a day (BID) | ORAL | 4 refills | Status: DC
Start: 2022-12-14 — End: 2023-02-15

## 2022-12-14 MED ORDER — ATORVASTATIN CALCIUM 40 MG PO TABS
40.0000 mg | ORAL_TABLET | Freq: Every day | ORAL | 0 refills | Status: DC
Start: 2022-12-14 — End: 2022-12-17

## 2022-12-14 MED ORDER — LISINOPRIL 20 MG PO TABS: 20.0000 mg | ORAL_TABLET | Freq: Every day | ORAL | 4 refills | Status: DC

## 2022-12-14 NOTE — Assessment & Plan Note (Addendum)
Chronic, great control based on latest A1c even off amaryl and ozempic only on metformin '500mg'$  bid - will stay off amaryl, restart ozempic 0.'5mg'$  weekly. Reassess control at CPE in 6 months. Continue healthy diet choices to date.

## 2022-12-14 NOTE — Progress Notes (Signed)
Patient ID: Jeffery Banks, male    DOB: 26-Sep-1971, 52 y.o.   MRN: [id number]  This visit was conducted in person.  BP 124/80   Pulse 86   Temp 97.8 F (36.6 C) (Temporal)   Ht '6\' 4"'$  (1.93 m)   Wt 260 lb 8 oz (118.2 kg)   SpO2 94%   BMI 31.71 kg/m    CC: DM f/u visit  Subjective:   HPI: Jeffery Banks is a 52 y.o. male presenting on 12/14/2022 for Medical Management of Chronic Issues (Here for DM f/u.)   Last seen 07/2021. Has been out of most meds for a few weeks. He has continued taking metformin.   DM - does regularly check sugars fasting 130s. Compliant with antihyperglycemic regimen which includes: Ozempic 0.'5mg'$  weekly (out for months), amaryl '2mg'$  daily, metformin '500mg'$  bid. Has cut out sweetened beverages, just water and black coffee. Stops eating after 7pm. Denies low sugars or hypoglycemic symptoms. Denies paresthesias, blurry vision. Last diabetic eye exam - upcoming in 2 wks ([facility]). Glucometer brand: one touch. Last foot exam: DUE. DSME: through [employer]).  Lab Results  Component Value Date   HGBA1C 6.5 (A) 12/14/2022   Diabetic Foot Exam - Simple   Simple Foot Form Diabetic Foot exam was performed with the following findings: Yes 12/14/2022  9:23 AM  Visual Inspection No deformities, no ulcerations, no other skin breakdown bilaterally: Yes Sensation Testing Intact to touch and monofilament testing bilaterally: Yes Pulse Check See comments: Yes Comments Slightly diminished pedal pulses    Lab Results  Component Value Date   MICROALBUR 0.7 04/15/2017        Relevant past medical, surgical, family and social history reviewed and updated as indicated. Interim medical history since our last visit reviewed. Allergies and medications reviewed and updated. Outpatient Medications Prior to Visit  Medication Sig Dispense Refill   aspirin EC 81 MG tablet Take 1 tablet (81 mg total) by mouth daily.     atorvastatin (LIPITOR) 40 MG tablet TAKE  1 TABLET (40 MG TOTAL) BY MOUTH DAILY. NEEDS OFFICE VISIT 90 tablet 0   glimepiride (AMARYL) 2 MG tablet TAKE 1 TABLET BY MOUTH DAILY BEFORE BREAKFAST. 90 tablet 2   lisinopril-hydrochlorothiazide (ZESTORETIC) 20-12.5 MG tablet TAKE 1 TABLET BY MOUTH EVERY DAY 90 tablet 0   metFORMIN (GLUCOPHAGE) 500 MG tablet TAKE 1 TABLET BY MOUTH 2 TIMES DAILY WITH A MEAL. 180 tablet 0   budesonide (PULMICORT FLEXHALER) 180 MCG/ACT inhaler Inhale 2 puffs into the lungs in the morning and at bedtime. 1 each 0   Continuous Blood Gluc Sensor (FREESTYLE LIBRE 14 DAY SENSOR) MISC by Does not apply route.     Insulin Pen Needle 32G X 6 MM MISC Use as directed for insulin shots 100 each 1   Semaglutide,0.25 or 0.'5MG'$ /DOS, (OZEMPIC, 0.25 OR 0.5 MG/DOSE,) 2 MG/1.5ML SOPN Inject 0.5 mg into the skin once a week. 2 mL 0   No facility-administered medications prior to visit.     Per HPI unless specifically indicated in ROS section below Review of Systems  Objective:  BP 124/80   Pulse 86   Temp 97.8 F (36.6 C) (Temporal)   Ht '6\' 4"'$  (1.93 m)   Wt 260 lb 8 oz (118.2 kg)   SpO2 94%   BMI 31.71 kg/m   Wt Readings from Last 3 Encounters:  12/14/22 260 lb 8 oz (118.2 kg)  08/16/21 252 lb (114.3 kg)  01/27/21 258 lb 4 oz (  117.1 kg)      Physical Exam Vitals and nursing note reviewed.  Constitutional:      Appearance: Normal appearance. He is not ill-appearing.  Eyes:     Extraocular Movements: Extraocular movements intact.     Conjunctiva/sclera: Conjunctivae normal.     Pupils: Pupils are equal, round, and reactive to light.  Cardiovascular:     Rate and Rhythm: Normal rate and regular rhythm.     Pulses: Normal pulses.     Heart sounds: Normal heart sounds. No murmur heard. Pulmonary:     Effort: Pulmonary effort is normal. No respiratory distress.     Breath sounds: Normal breath sounds. No wheezing, rhonchi or rales.  Musculoskeletal:     Right lower leg: No edema.     Left lower leg: No edema.      Comments: See HPI for foot exam if done  Skin:    General: Skin is warm and dry.     Findings: No rash.  Neurological:     Mental Status: He is alert.  Psychiatric:        Mood and Affect: Mood normal.        Behavior: Behavior normal.       Results for orders placed or performed in visit on 12/14/22  POCT glycosylated hemoglobin (Hb A1C)  Result Value Ref Range   Hemoglobin A1C 6.5 (A) 4.0 - 5.6 %   HbA1c POC (<> result, manual entry)     HbA1c, POC (prediabetic range)     HbA1c, POC (controlled diabetic range)      Assessment & Plan:   Problem List Items Addressed This Visit     HTN (hypertension)    Chronic, stable even off medication - will restart lisinopril '20mg'$  daily, stay off HCTZ.       Relevant Medications   atorvastatin (LIPITOR) 40 MG tablet   lisinopril (ZESTRIL) 20 MG tablet   Dyslipidemia associated with type 2 diabetes mellitus ([place])    Update FLP.  The 10-year ASCVD risk score (Arnett DK, et al., 2019) is: 3.7%   Values used to calculate the score:     Age: 34 years     Sex: Male     Is Non-Hispanic African American: No     Diabetic: Yes     Tobacco smoker: No     Systolic Blood Pressure: A999333 mmHg     Is BP treated: Yes     HDL Cholesterol: 56 mg/dL     Total Cholesterol: 145 mg/dL       Relevant Medications   atorvastatin (LIPITOR) 40 MG tablet   metFORMIN (GLUCOPHAGE) 500 MG tablet   Semaglutide,0.25 or 0.'5MG'$ /DOS, (OZEMPIC, 0.25 OR 0.5 MG/DOSE,) 2 MG/3ML SOPN   lisinopril (ZESTRIL) 20 MG tablet   Other Relevant Orders   Lipid panel   Comprehensive metabolic panel   Type 2 diabetes mellitus with other specified complication (HCC) - Primary    Chronic, great control based on latest A1c even off amaryl and ozempic only on metformin '500mg'$  bid - will stay off amaryl, restart ozempic 0.'5mg'$  weekly. Reassess control at CPE in 6 months. Continue healthy diet choices to date.       Relevant Medications   atorvastatin (LIPITOR) 40 MG tablet    metFORMIN (GLUCOPHAGE) 500 MG tablet   Semaglutide,0.25 or 0.'5MG'$ /DOS, (OZEMPIC, 0.25 OR 0.5 MG/DOSE,) 2 MG/3ML SOPN   lisinopril (ZESTRIL) 20 MG tablet   Other Relevant Orders   POCT glycosylated hemoglobin (Hb A1C) (Completed)  Other Visit Diagnoses     Need for influenza vaccination       Relevant Orders   Flu Vaccine QUAD 104moIM (Fluarix, Fluzone & Alfiuria Quad PF) (Completed)        Meds ordered this encounter  Medications   atorvastatin (LIPITOR) 40 MG tablet    Sig: Take 1 tablet (40 mg total) by mouth daily. NEEDS OFFICE VISIT    Dispense:  90 tablet    Refill:  0   metFORMIN (GLUCOPHAGE) 500 MG tablet    Sig: Take 1 tablet (500 mg total) by mouth 2 (two) times daily with a meal.    Dispense:  180 tablet    Refill:  4   Semaglutide,0.25 or 0.'5MG'$ /DOS, (OZEMPIC, 0.25 OR 0.5 MG/DOSE,) 2 MG/3ML SOPN    Sig: Inject 0.5 mg into the skin once a week.    Dispense:  3 mL    Refill:  11   lisinopril (ZESTRIL) 20 MG tablet    Sig: Take 1 tablet (20 mg total) by mouth daily.    Dispense:  90 tablet    Refill:  4    Orders Placed This Encounter  Procedures   Flu Vaccine QUAD 632moM (Fluarix, Fluzone & Alfiuria Quad PF)   Lipid panel   Comprehensive metabolic panel   POCT glycosylated hemoglobin (Hb A1C)    Patient Instructions  Flu shot today Labs today Restart medicines - all refilled as per new med list.  Sugars are doing well today. Retry ozempic sent to pharmacy.  Return in 6 months for physical  Follow up plan: Return in about 6 months (around 06/14/2023) for annual exam, prior fasting for blood work.  JaRia BushMD

## 2022-12-14 NOTE — Assessment & Plan Note (Addendum)
Chronic, stable even off medication - will restart lisinopril '20mg'$  daily, stay off HCTZ.

## 2022-12-14 NOTE — Patient Instructions (Addendum)
Flu shot today Labs today Restart medicines - all refilled as per new med list.  Sugars are doing well today. Retry ozempic sent to pharmacy.  Return in 6 months for physical

## 2022-12-14 NOTE — Assessment & Plan Note (Signed)
Update FLP.  The 10-year ASCVD risk score (Arnett DK, et al., 2019) is: 3.7%   Values used to calculate the score:     Age: 52 years     Sex: Male     Is Non-Hispanic African American: No     Diabetic: Yes     Tobacco smoker: No     Systolic Blood Pressure: A999333 mmHg     Is BP treated: Yes     HDL Cholesterol: 56 mg/dL     Total Cholesterol: 145 mg/dL

## 2022-12-17 ENCOUNTER — Other Ambulatory Visit: Payer: Self-pay | Admitting: Family Medicine

## 2022-12-17 DIAGNOSIS — R7401 Elevation of levels of liver transaminase levels: Secondary | ICD-10-CM

## 2022-12-17 MED ORDER — ATORVASTATIN CALCIUM 40 MG PO TABS: 40.0000 mg | ORAL_TABLET | Freq: Every day | ORAL | 4 refills | Status: DC

## 2023-01-15 ENCOUNTER — Ambulatory Visit
Admission: RE | Admit: 2023-01-15 | Discharge: 2023-01-15 | Disposition: A | Discharge status: Home / Self Care | Payer: 59 | Source: Ambulatory Visit | Attending: Family Medicine | Admitting: Family Medicine

## 2023-01-15 DIAGNOSIS — R748 Abnormal levels of other serum enzymes: Secondary | ICD-10-CM | POA: Diagnosis not present

## 2023-01-15 DIAGNOSIS — R7401 Elevation of levels of liver transaminase levels: Secondary | ICD-10-CM

## 2023-01-16 ENCOUNTER — Encounter: Payer: Self-pay | Admitting: Family Medicine

## 2023-01-16 DIAGNOSIS — K760 Fatty (change of) liver, not elsewhere classified: Secondary | ICD-10-CM | POA: Insufficient documentation

## 2023-02-14 ENCOUNTER — Telehealth: Payer: Self-pay

## 2023-02-14 NOTE — Telephone Encounter (Addendum)
Pt is scheduled for OV with Dr. Reece Agar tomorrow  at 8:00. However, Dr. Timoteo Expose instructions from 12/14/22 labs was to rpt labs only in 2 mos. This should be a lab only visit. Also, pt was instructed to schedule CPE (around 06/14/23) and fasting lab visit 1 wk prior.   Attempted to contact pt. No answer. No vm. Lvm for wife, Jeffery Banks (on dpr), asking her to have pt call back.   Needs to change 02/15/23 OV to lab visit (non-fasting). Then schedule CPE and fasting lab visits as stated above.

## 2023-02-15 ENCOUNTER — Encounter: Payer: Self-pay | Admitting: Family Medicine

## 2023-02-15 ENCOUNTER — Ambulatory Visit (INDEPENDENT_AMBULATORY_CARE_PROVIDER_SITE_OTHER): Payer: 59 | Admitting: Family Medicine

## 2023-02-15 ENCOUNTER — Encounter: Payer: Self-pay | Admitting: Gastroenterology

## 2023-02-15 VITALS — BP 122/82 | HR 91 | Temp 97.1°F | Ht 76.0 in | Wt 223.5 lb

## 2023-02-15 DIAGNOSIS — E1169 Type 2 diabetes mellitus with other specified complication: Secondary | ICD-10-CM

## 2023-02-15 DIAGNOSIS — R7401 Elevation of levels of liver transaminase levels: Secondary | ICD-10-CM | POA: Diagnosis not present

## 2023-02-15 DIAGNOSIS — I10 Essential (primary) hypertension: Secondary | ICD-10-CM

## 2023-02-15 DIAGNOSIS — K760 Fatty (change of) liver, not elsewhere classified: Secondary | ICD-10-CM | POA: Diagnosis not present

## 2023-02-15 DIAGNOSIS — R1111 Vomiting without nausea: Secondary | ICD-10-CM

## 2023-02-15 DIAGNOSIS — R634 Abnormal weight loss: Secondary | ICD-10-CM

## 2023-02-15 LAB — COMPREHENSIVE METABOLIC PANEL
ALT: 43 U/L (ref 0–53)
AST: 34 U/L (ref 0–37)
Albumin: 4.4 g/dL (ref 3.5–5.2)
Alkaline Phosphatase: 70 U/L (ref 39–117)
BUN: 12 mg/dL (ref 6–23)
CO2: 28 mEq/L (ref 19–32)
Calcium: 9.7 mg/dL (ref 8.4–10.5)
Chloride: 105 mEq/L (ref 96–112)
Creatinine, Ser: 1.02 mg/dL (ref 0.40–1.50)
GFR: 86.09 mL/min (ref >60.00)
Glucose, Bld: 86 mg/dL (ref 70–99)
Potassium: 4.2 mEq/L (ref 3.5–5.1)
Sodium: 143 mEq/L (ref 135–145)
Total Bilirubin: 0.7 mg/dL (ref 0.2–1.2)
Total Protein: 6.7 g/dL (ref 6.0–8.3)

## 2023-02-15 LAB — LIPASE: Lipase: 69 U/L — ABNORMAL HIGH (ref 11.0–59.0)

## 2023-02-15 LAB — IBC PANEL
Iron: 104 ug/dL (ref 42–165)
Saturation Ratios: 28.6 % (ref 20.0–50.0)
TIBC: 364 ug/dL (ref 250.0–450.0)
Transferrin: 260 mg/dL (ref 212.0–360.0)

## 2023-02-15 LAB — CBC WITH DIFFERENTIAL/PLATELET
Basophils Absolute: 0 10*3/uL (ref 0.0–0.1)
Basophils Relative: 0.6 % (ref 0.0–3.0)
Eosinophils Absolute: 0.2 10*3/uL (ref 0.0–0.7)
Eosinophils Relative: 5.7 % — ABNORMAL HIGH (ref 0.0–5.0)
HCT: 46 % (ref 39.0–52.0)
Hemoglobin: 15.9 g/dL (ref 13.0–17.0)
Lymphocytes Relative: 38.6 % (ref 12.0–46.0)
Lymphs Abs: 1.3 10*3/uL (ref 0.7–4.0)
MCHC: 34.6 g/dL (ref 30.0–36.0)
MCV: 94.4 fl (ref 78.0–100.0)
Monocytes Absolute: 0.2 10*3/uL (ref 0.1–1.0)
Monocytes Relative: 6.8 % (ref 3.0–12.0)
Neutro Abs: 1.7 10*3/uL (ref 1.4–7.7)
Neutrophils Relative %: 48.3 % (ref 43.0–77.0)
Platelets: 167 10*3/uL (ref 150.0–400.0)
RBC: 4.87 Mil/uL (ref 4.22–5.81)
RDW: 13.2 % (ref 11.5–15.5)
WBC: 3.5 10*3/uL — ABNORMAL LOW (ref 4.0–10.5)

## 2023-02-15 LAB — TSH: TSH: 1.7 u[IU]/mL (ref 0.35–5.50)

## 2023-02-15 MED ORDER — METFORMIN HCL 500 MG PO TABS: 500.0000 mg | ORAL_TABLET | Freq: Every day | ORAL | 1 refills | Status: DC

## 2023-02-15 NOTE — Assessment & Plan Note (Signed)
Recent RUQ US showing fatty liver changes. Update labs today including LFTs

## 2023-02-15 NOTE — Assessment & Plan Note (Signed)
Great control based on cbg readings.  Given vomiting, stop ozempic. Ok to continue metformin once daily dosing.  Check fructosamine today. RTC 6 wks DM f/u visit.

## 2023-02-15 NOTE — Progress Notes (Signed)
Ph: [phone]       Fax: [phone]   Patient ID: Jeffery Banks, male    DOB: July 17, 1971, 52 y.o.   MRN: [id number]  This visit was conducted in person.  BP 122/82   Pulse 91   Temp (!) 97.1 F (36.2 C) (Temporal)   Ht 6\' 4"  (1.93 m)   Wt 223 lb 8 oz (101.4 kg)   SpO2 97%   BMI 27.21 kg/m    CC: vomiting  Subjective:   HPI: Jeffery Banks is a 52 y.o. male presenting on 02/15/2023 for Vomiting (C/o vomiting after eating. Started about 6 mos ago off and on. Now after every meal. Also, here for 2 mo labs. )   6 mo h/o vomiting after meals, initially intermittent but now over the past month becoming more continuous after each meal - food comes back up, NBNB emesis. This was even prior to restarting ozempic 11/2022. He was off ozempic for about a year prior.  Changed diet - cut out alcohol, added sugar, sodas, eating more salads and grilled chicken and cut out snacks - overall healthier eating. He has started working out 1 hour every day.  40 lb weight loss in the past 2 months.  Smaller size bowel movements, but still daily, passing gas well.  Overall feeling well.   No nausea.  Seems to tolerate liquids fine.  No fevers/chills, abd pain, dysphagia, odynophagia, no early satiety.  No hematuria, blood in stool, diarrhea, constipation.   DM - continues metformin 500mg  bid and ozempic 0.5mg  weekly. Checks sugars daily fasting 90s, 30 min after eating low 100s. No hypoglycemia, no hyperglycemia.  Lab Results  Component Value Date   HGBA1C 6.5 (A) 12/14/2022    Transaminitis with AST 86, ALT 136 - RUQ [place] 12/2022 only showed fatty liver changes Has not had colon cancer screening      Relevant past medical, surgical, family and social history reviewed and updated as indicated. Interim medical history since our last visit reviewed. Allergies and medications reviewed and updated. Outpatient Medications Prior to Visit  Medication Sig Dispense Refill   aspirin EC 81 MG  tablet Take 1 tablet (81 mg total) by mouth daily.     atorvastatin (LIPITOR) 40 MG tablet Take 1 tablet (40 mg total) by mouth daily. 90 tablet 4   lisinopril (ZESTRIL) 20 MG tablet Take 1 tablet (20 mg total) by mouth daily. 90 tablet 4   metFORMIN (GLUCOPHAGE) 500 MG tablet Take 1 tablet (500 mg total) by mouth 2 (two) times daily with a meal. 180 tablet 4   Semaglutide,0.25 or 0.5MG /DOS, (OZEMPIC, 0.25 OR 0.5 MG/DOSE,) 2 MG/3ML SOPN Inject 0.5 mg into the skin once a week. 3 mL 11   No facility-administered medications prior to visit.     Per HPI unless specifically indicated in ROS section below Review of Systems  Objective:  BP 122/82   Pulse 91   Temp (!) 97.1 F (36.2 C) (Temporal)   Ht 6\' 4"  (1.93 m)   Wt 223 lb 8 oz (101.4 kg)   SpO2 97%   BMI 27.21 kg/m   Wt Readings from Last 3 Encounters:  02/15/23 223 lb 8 oz (101.4 kg)  12/14/22 260 lb 8 oz (118.2 kg)  08/16/21 252 lb (114.3 kg)      Physical Exam Vitals and nursing note reviewed.  Constitutional:      Appearance: Normal appearance. He is not ill-appearing.  HENT:     Head:  Normocephalic and atraumatic.     Mouth/Throat:     Mouth: Mucous membranes are moist.     Pharynx: Oropharynx is clear. No oropharyngeal exudate or posterior oropharyngeal erythema.  Eyes:     Extraocular Movements: Extraocular movements intact.     Pupils: Pupils are equal, round, and reactive to light.  Neck:     Thyroid: No thyroid mass or thyromegaly.  Cardiovascular:     Rate and Rhythm: Normal rate and regular rhythm.     Pulses: Normal pulses.     Heart sounds: Normal heart sounds. No murmur heard. Pulmonary:     Effort: Pulmonary effort is normal. No respiratory distress.     Breath sounds: Normal breath sounds. No wheezing, rhonchi or rales.  Abdominal:     General: Bowel sounds are normal. There is no distension.     Palpations: Abdomen is soft. There is no mass.     Tenderness: There is no abdominal tenderness. There  is no guarding or rebound.     Hernia: No hernia is present.  Musculoskeletal:     Cervical back: Normal range of motion and neck supple.     Right lower leg: No edema.     Left lower leg: No edema.  Skin:    General: Skin is warm and dry.     Findings: No rash.  Neurological:     Mental Status: He is alert.  Psychiatric:        Mood and Affect: Mood normal.        Behavior: Behavior normal.       Results for orders placed or performed in visit on 12/14/22  Lipid panel  Result Value Ref Range   Cholesterol 160 0 - 200 mg/dL   Triglycerides 960.4 (H) 0.0 - 149.0 mg/dL   HDL 54.09 (L) >81.19 mg/dL   VLDL 14.7 (H) 0.0 - 82.9 mg/dL   Total CHOL/HDL Ratio 5    NonHDL 125.04   Comprehensive metabolic panel  Result Value Ref Range   Sodium 140 135 - 145 mEq/L   Potassium 4.0 3.5 - 5.1 mEq/L   Chloride 101 96 - 112 mEq/L   CO2 28 19 - 32 mEq/L   Glucose, Bld 134 (H) 70 - 99 mg/dL   BUN 15 6 - 23 mg/dL   Creatinine, Ser 5.62 0.40 - 1.50 mg/dL   Total Bilirubin 0.8 0.2 - 1.2 mg/dL   Alkaline Phosphatase 73 39 - 117 U/L   AST 86 (H) 0 - 37 U/L   ALT 136 (H) 0 - 53 U/L   Total Protein 7.0 6.0 - 8.3 g/dL   Albumin 4.8 3.5 - 5.2 g/dL   GFR 13.08 >65.78 mL/min   Calcium 10.0 8.4 - 10.5 mg/dL  LDL cholesterol, direct  Result Value Ref Range   Direct LDL 97.0 mg/dL  POCT glycosylated hemoglobin (Hb A1C)  Result Value Ref Range   Hemoglobin A1C 6.5 (A) 4.0 - 5.6 %   HbA1c POC (<> result, manual entry)     HbA1c, POC (prediabetic range)     HbA1c, POC (controlled diabetic range)      Assessment & Plan:   Problem List Items Addressed This Visit     HTN (hypertension)    Chronic, stable on lisinopril 20mg  daily.       Type 2 diabetes mellitus with other specified complication (HCC)    Great control based on cbg readings.  Given vomiting, stop ozempic. Ok to continue metformin once daily dosing.  Check fructosamine today. RTC 6 wks DM f/u visit.       Relevant  Medications   metFORMIN (GLUCOPHAGE) 500 MG tablet   Other Relevant Orders   Fructosamine   Fatty liver   Vomiting without nausea - Primary    New over 6 months (even prior to being on ozempic), progressively worsening since ozepmic started 2 months ago, associated with abnormal weight loss of almost 40lbs over 2 months.  Stop ozempic.  Check labs. Check CT abd/pelvis eval intra-abdominal pathology contributing. Refer to GI to discuss EGD - also due for colon cancer screening.   Pt agrees with plan.       Relevant Orders   CT Abdomen Pelvis W Contrast   Lipase   Ambulatory referral to Gastroenterology   Transaminitis    Recent RUQ US showing fatty liver changes. Update labs today including LFTs      Relevant Orders   Comprehensive metabolic panel   CBC with Differential/Platelet   Lipase   Weight loss, abnormal    Almost 40 lb weight loss in the past 2 months - he has made significant intentional changes to diet and lifestyle however abnormal given rate of weight loss and associated vomiting. See above.       Relevant Orders   CT Abdomen Pelvis W Contrast   Lipase   Ambulatory referral to Gastroenterology     Meds ordered this encounter  Medications   metFORMIN (GLUCOPHAGE) 500 MG tablet    Sig: Take 1 tablet (500 mg total) by mouth daily with breakfast.    Dispense:  90 tablet    Refill:  1    Note new sig    Orders Placed This Encounter  Procedures   CT Abdomen Pelvis W Contrast    Standing Status:   Future    Standing Expiration Date:   02/15/2024    Order Specific Question:   If indicated for the ordered procedure, I authorize the administration of contrast media per Radiology protocol    Answer:   Yes    Order Specific Question:   Does the patient have a contrast media/X-ray dye allergy?    Answer:   No    Order Specific Question:   Preferred imaging location?    Answer:   [facility]    Order Specific Question:   If indicated for the ordered  procedure, I authorize the administration of oral contrast media per Radiology protocol    Answer:   Yes   Comprehensive metabolic panel   CBC with Differential/Platelet   Fructosamine   Lipase   Ambulatory referral to Gastroenterology    Referral Priority:   Urgent    Referral Type:   Consultation    Referral Reason:   Specialty Services Required    Number of Visits Requested:   1    Patient Instructions  Stop ozempic, continue once daily metformin.  I will refer you to GI for further evaluation of vomiting.  We will order CT abd/pelvis given vomiting and weight loss.  Return in 6 weeks for follow up visit.   Follow up plan: Return in about 6 weeks (around 03/29/2023) for follow up visit.  Eustaquio Boyden, MD

## 2023-02-15 NOTE — Patient Instructions (Addendum)
Stop ozempic, continue once daily metformin.  I will refer you to GI for further evaluation of vomiting.  We will order CT abd/pelvis given vomiting and weight loss.  Return in 6 weeks for follow up visit.

## 2023-02-15 NOTE — Assessment & Plan Note (Addendum)
New over 6 months (even prior to being on ozempic), progressively worsening since ozepmic started 2 months ago, associated with abnormal weight loss of almost 40lbs over 2 months.  Stop ozempic.  Check labs. Check CT abd/pelvis eval intra-abdominal pathology contributing. Refer to GI to discuss EGD - also due for colon cancer screening.   Pt agrees with plan.

## 2023-02-15 NOTE — Assessment & Plan Note (Addendum)
Almost 40 lb weight loss in the past 2 months - he has made significant intentional changes to diet and lifestyle however abnormal given rate of weight loss and associated vomiting. See above.

## 2023-02-15 NOTE — Assessment & Plan Note (Signed)
Chronic, stable on lisinopril 20mg daily °

## 2023-02-15 NOTE — Telephone Encounter (Signed)
Pt came to [facility] today to discuss a separate issue.

## 2023-02-19 LAB — HEPATITIS PANEL, ACUTE
Hep A IgM: NONREACTIVE
Hep B C IgM: NONREACTIVE
Hepatitis B Surface Ag: NONREACTIVE
Hepatitis C Ab: NONREACTIVE

## 2023-02-19 LAB — FRUCTOSAMINE: Fructosamine: 218 umol/L (ref 205–285)

## 2023-02-20 ENCOUNTER — Ambulatory Visit: Payer: 59 | Admitting: Gastroenterology

## 2023-02-20 ENCOUNTER — Encounter: Payer: Self-pay | Admitting: Gastroenterology

## 2023-02-20 VITALS — BP 90/68 | HR 104 | Ht 74.25 in | Wt 221.4 lb

## 2023-02-20 DIAGNOSIS — R7401 Elevation of levels of liver transaminase levels: Secondary | ICD-10-CM

## 2023-02-20 DIAGNOSIS — R634 Abnormal weight loss: Secondary | ICD-10-CM | POA: Diagnosis not present

## 2023-02-20 DIAGNOSIS — R1319 Other dysphagia: Secondary | ICD-10-CM

## 2023-02-20 DIAGNOSIS — R112 Nausea with vomiting, unspecified: Secondary | ICD-10-CM | POA: Diagnosis not present

## 2023-02-20 NOTE — Patient Instructions (Addendum)
_______________________________________________________  If your blood pressure at your visit was 140/90 or greater, please contact your primary care physician to follow up on this.  _______________________________________________________  If you are age 50 or older, your body mass index should be between 23-30. Your Body mass index is 28.23 kg/m. If this is out of the aforementioned range listed, please consider follow up with your Primary Care Provider.  If you are age 73 or younger, your body mass index should be between 19-25. Your Body mass index is 28.23 kg/m. If this is out of the aformentioned range listed, please consider follow up with your Primary Care Provider.   ________________________________________________________  The [facility] providers would like to encourage you to use [facility] to communicate with providers for non-urgent requests or questions.  Due to long hold times on the telephone, sending your provider a message by [facility] may be a faster and more efficient way to get a response.  Please allow 48 business hours for a response.  Please remember that this is for non-urgent requests.  _______________________________________________________  Bonita Quin have been scheduled for an endoscopy. Please follow written instructions given to you at your visit today. If you use inhalers (even only as needed), please bring them with you on the day of your procedure.   Due to recent changes in healthcare laws, you may see the results of your imaging and laboratory studies on MyChart before your provider has had a chance to review them.  We understand that in some cases there may be results that are confusing or concerning to you. Not all laboratory results come back in the same time frame and the provider may be waiting for multiple results in order to interpret others.  Please give [place] 48 hours in order for your provider to thoroughly review all the results before contacting the office for  clarification of your results.   It was a pleasure to see you today!  Thank you for trusting me with your gastrointestinal care!

## 2023-02-20 NOTE — Progress Notes (Signed)
[facility] Consult Note:  History: Jeffery Banks 02/20/2023  Referring provider: Eustaquio Boyden, MD  Reason for consult/chief complaint: Dysphagia (Food getting stuck in esophagus, food coming back up soon after eating, having vomiting and sometimes regurgitation after eating, having to drink water to get food to go down)   Subjective  HPI: This is a 52 year old man referred after recent primary care visit with Dr. Sharen Hones, at which time he was describing over 6 months of frequent postprandial vomiting.  Symptoms began prior to starting Ozempic but worsened while taking that until it was recently discontinued. He appears to been diagnosed with diabetes in late 2021 when he had a glucose of 850 and hemoglobin A1c of 9.6, which was then 10.4 in October 2022, then 6.5 in February of this year. Labs noted and ALT predominant transaminitis in February of this year, then normalized on more recent labs. _________________  Jeffery Banks describes frequent feelings of food feeling stuck in the chest or sometimes passing well enough but then "coming back up".  This occurred intermittently starting sometime last year but escalated significantly in the last few months.  The symptoms now occur nearly all the time and are described as both of fullness or heaviness in the lower sternum or epigastrium after meals such that he has decreased his food intake and lost about 40 pounds in the last 3 months.  He denies change in bowel habits or rectal bleeding. He was not having regular symptoms of heartburn or regurgitation prior to the other symptom onset last year or recently. He also clarifies that while the symptoms did clearly start before he was on Ozempic, he does not feel as if they worsened while he was on the treatment before was recently discontinued. ROS:  Review of Systems   Past Medical History: Past Medical History:  Diagnosis Date   Alcohol abuse, in remission college    DM (diabetes mellitus) (HCC)    Generalized headaches    daily   GERD (gastroesophageal reflux disease)    HTN (hypertension)    Hx of migraines    as child   Obesity    borderline   Stroke [facility])      Past Surgical History: Past Surgical History:  Procedure Laterality Date   APPENDECTOMY  1982     Family History: Family History  Problem Relation Age of Onset   Hypertension Mother    Heart disease Mother    Stroke Maternal Grandmother    CAD Maternal Grandmother        MI   Hypertension Maternal Grandfather    Coronary artery disease Maternal Grandfather 70       MI   Stroke Maternal Grandfather    Hypertension Paternal Grandfather    Breast cancer Maternal Aunt    Diabetes Neg Hx     Social History: Social History   Socioeconomic History   Marital status: Divorced    Spouse name: Not on file   Number of children: 3   Years of education: Not on file   Highest education level: Master's degree (e.g., MA, MS, MEng, MEd, MSW, MBA)  Occupational History   Not on file  Tobacco Use   Smoking status: Former    Packs/day: 0.25    Years: 7.00    Additional pack years: 0.00    Total pack years: 1.75    Types: Cigarettes    Quit date: 10/22/2010    Years since quitting: 12.3   Smokeless tobacco: Never  Vaping  Use   Vaping Use: Never used  Substance and Sexual Activity   Alcohol use: No   Drug use: No   Sexual activity: Not Currently    Birth control/protection: None  Other Topics Concern   Not on file  Social History Narrative   Caffeine: Decreased coffee in am   Divorced from wife   Has 3 children   Occupation: Transport planner for [employer]   Edu: Associate's degree   Activity: walking at night   Diet: water daily, some fruits/vegetables, red meat 2x/wk, fish 1x/mo   Social Determinants of Health   Financial Resource Strain: Low Risk  (02/11/2023)   Overall Financial Resource Strain (CARDIA)    Difficulty of Paying Living Expenses: Not hard at all  Food  Insecurity: No Food Insecurity (02/11/2023)   Hunger Vital Sign    Worried About Running Out of Food in the Last Year: Never true    Ran Out of Food in the Last Year: Never true  Transportation Needs: No Transportation Needs (02/11/2023)   PRAPARE - Administrator, Civil Service (Medical): No    Lack of Transportation (Non-Medical): No  Physical Activity: Sufficiently Active (02/11/2023)   Exercise Vital Sign    Days of Exercise per Week: 6 days    Minutes of Exercise per Session: 60 min  Stress: Stress Concern Present (02/11/2023)   [employer] of Occupational Health - Occupational Stress Questionnaire    Feeling of Stress : To some extent  Social Connections: Moderately Integrated (02/11/2023)   Social Connection and Isolation Panel [NHANES]    Frequency of Communication with Friends and Family: More than three times a week    Frequency of Social Gatherings with Friends and Family: More than three times a week    Attends Religious Services: More than 4 times per year    Active Member of [employer] or Organizations: Yes    Attends Engineer, structural: More than 4 times per year    Marital Status: Divorced    Allergies: No Known Allergies  Outpatient Meds: Current Outpatient Medications  Medication Sig Dispense Refill   aspirin EC 81 MG tablet Take 1 tablet (81 mg total) by mouth daily.     atorvastatin (LIPITOR) 40 MG tablet Take 1 tablet (40 mg total) by mouth daily. 90 tablet 4   lisinopril (ZESTRIL) 20 MG tablet Take 1 tablet (20 mg total) by mouth daily. 90 tablet 4   metFORMIN (GLUCOPHAGE) 500 MG tablet Take 1 tablet (500 mg total) by mouth daily with breakfast. 90 tablet 1   No current facility-administered medications for this visit.    Baby aspirin daily because of a prior TIA.  ___________________________________________________________________ Objective   Exam:  BP 90/68 (BP Location: Left Arm, Patient Position: Sitting, Cuff Size: Normal)    Pulse (!) 104   Ht 6' 2.25" (1.886 m) Comment: height measured without shoes  Wt 221 lb 6 oz (100.4 kg)   BMI 28.23 kg/m  Wt Readings from Last 3 Encounters:  02/20/23 221 lb 6 oz (100.4 kg)  02/15/23 223 lb 8 oz (101.4 kg)  12/14/22 260 lb 8 oz (118.2 kg)   Note marked documented weight loss above  General: Pleasant and conversational, normal vocal quality Eyes: sclera anicteric, no redness ENT: oral mucosa moist without lesions, no cervical or supraclavicular lymphadenopathy CV: Regular without appreciable murmur, no JVD, no peripheral edema Resp: clear to auscultation bilaterally, normal RR and effort noted GI: soft, no tenderness, with active bowel sounds.  No guarding or palpable organomegaly noted. Skin; warm and dry, no rash or jaundice noted Neuro: awake, alert and oriented x 3. Normal gross motor function and fluent speech  Labs:     Latest Ref Rng & Units 02/15/2023    8:32 AM 08/16/2021   11:08 AM 10/04/2020   10:48 AM  CBC  WBC 4.0 - 10.5 K/uL 3.5  3.4  4.8   Hemoglobin 13.0 - 17.0 g/dL 04.5  40.9  81.1   Hematocrit 39.0 - 52.0 % 46.0  43.3  42.1   Platelets 150.0 - 400.0 K/uL 167.0  175.0  217       Latest Ref Rng & Units 02/15/2023    8:32 AM 12/14/2022    9:30 AM 08/16/2021   11:08 AM  CMP  Glucose 70 - 99 mg/dL 86  914  782   BUN 6 - 23 mg/dL 12  15  16    Creatinine 0.40 - 1.50 mg/dL 9.56  2.13  0.86   Sodium 135 - 145 mEq/L 143  140  132   Potassium 3.5 - 5.1 mEq/L 4.2  4.0  4.4   Chloride 96 - 112 mEq/L 105  101  93   CO2 19 - 32 mEq/L 28  28  30    Calcium 8.4 - 10.5 mg/dL 9.7  57.8  9.9   Total Protein 6.0 - 8.3 g/dL 6.7  7.0    Total Bilirubin 0.2 - 1.2 mg/dL 0.7  0.8    Alkaline Phos 39 - 117 U/L 70  73    AST 0 - 37 U/L 34  86    ALT 0 - 53 U/L 43  136     Negative acute viral hepatitis panel on 02/15/2023  Radiologic Studies:  CLINICAL DATA:  Transaminitis.   EXAM: ULTRASOUND ABDOMEN LIMITED RIGHT UPPER QUADRANT   COMPARISON:  None  Available.   FINDINGS: Gallbladder:   No gallstones or wall thickening visualized. No sonographic Murphy sign noted by sonographer.   Common bile duct:   Diameter: 4.7 mm   Liver:   No focal lesion identified. Diffuse increased echotexture. Portal vein is patent on color Doppler imaging with normal direction of blood flow towards the liver.   Other: None.   IMPRESSION: Diffuse increased echotexture of the liver. This is a nonspecific finding but can be seen in fatty infiltration of the liver.     Electronically Signed   By: Sherian Rein M.D.   On: 01/15/2023 08:41    Assessment: Encounter Diagnoses  Name Primary?   Nausea and vomiting, unspecified vomiting type Yes   Weight loss, abnormal    Transaminitis    Esophageal dysphagia     It is difficult to determine if this is dysphagia or vomiting, but it sounds more like the former.  If so, raises concern for esophageal stricture, EOE, achalasia or malignancy, and the weight loss is quite worrisome.  If this is vomiting coming from a gastric cause, then chronic aspirin use raises suspicion for ulcer induced gastric outlet obstruction.  His diabetes is currently under good control, decreasing likelihood of gastroparesis.  The cause of transaminitis months ago is uncertain, and also not clear if it is related to the symptoms since it resolved on follow-up testing.  That and the lack of biliary ductal dilatation on the ultrasound speak against biliary obstruction. Plan:  Upper endoscopy next week.  He was agreeable after discussion of procedure and risks.  The benefits and risks of the planned procedure  were described in detail with the patient or (when appropriate) their health care proxy.  Risks were outlined as including, but not limited to, bleeding, infection, perforation, adverse medication reaction leading to cardiac or pulmonary decompensation, pancreatitis (if ERCP).  The limitation of incomplete mucosal visualization  was also discussed.  No guarantees or warranties were given.  If no clear explanation on the EGD, cross-sectional imaging chest and abdomen to follow.  Thank you for the courtesy of this consult.  Please call me with any questions or concerns.  Charlie Pitter III  CC: Referring provider noted above

## 2023-02-24 ENCOUNTER — Encounter: Payer: Self-pay | Admitting: Certified Registered Nurse Anesthetist

## 2023-02-26 ENCOUNTER — Encounter: Payer: Self-pay | Admitting: Gastroenterology

## 2023-02-26 ENCOUNTER — Ambulatory Visit (AMBULATORY_SURGERY_CENTER): Payer: 59 | Admitting: Gastroenterology

## 2023-02-26 ENCOUNTER — Telehealth: Payer: Self-pay

## 2023-02-26 VITALS — BP 111/77 | HR 65 | Temp 98.0°F | Resp 18 | Ht 74.0 in | Wt 221.0 lb

## 2023-02-26 DIAGNOSIS — R112 Nausea with vomiting, unspecified: Secondary | ICD-10-CM

## 2023-02-26 DIAGNOSIS — E119 Type 2 diabetes mellitus without complications: Secondary | ICD-10-CM | POA: Diagnosis not present

## 2023-02-26 DIAGNOSIS — I10 Essential (primary) hypertension: Secondary | ICD-10-CM | POA: Diagnosis not present

## 2023-02-26 DIAGNOSIS — R634 Abnormal weight loss: Secondary | ICD-10-CM

## 2023-02-26 DIAGNOSIS — K319 Disease of stomach and duodenum, unspecified: Secondary | ICD-10-CM | POA: Diagnosis not present

## 2023-02-26 MED ORDER — SODIUM CHLORIDE 0.9 % IV SOLN: 500.0000 mL | Freq: Once | INTRAVENOUS | Status: DC

## 2023-02-26 NOTE — Patient Instructions (Addendum)
Recommendation:  - Patient has a contact number available for                            emergencies. The signs and symptoms of potential                            delayed complications were discussed with the                            patient. Return to normal activities tomorrow.                            Written discharge instructions were provided to the                            patient.                           - Resume previous diet.                           - Continue present medications.                           - Await pathology results.                           - Perform a CT scan (computed tomography) of                            abdomen with contrast and pelvis with contrast As                            soon as possible (ideally within the next week).  YOU HAD AN ENDOSCOPIC PROCEDURE TODAY AT THE [facility]:   Refer to the procedure report that was given to you for any specific questions about what was found during the examination.  If the procedure report does not answer your questions, please call your gastroenterologist to clarify.  If you requested that your care partner not be given the details of your procedure findings, then the procedure report has been included in a sealed envelope for you to review at your convenience later.  YOU SHOULD EXPECT: Some feelings of bloating in the abdomen. Passage of more gas than usual.  Walking can help get rid of the air that was put into your GI tract during the procedure and reduce the bloating. If you had a lower endoscopy (such as a colonoscopy or flexible sigmoidoscopy) you may notice spotting of blood in your stool or on the toilet paper. If you underwent a bowel prep for your procedure, you may not have a normal bowel movement for a few days.  Please Note:  You might notice some irritation and congestion in your nose or some drainage.  This is from the oxygen used during your procedure.  There is no need for  concern and it should clear up in a day or so.  SYMPTOMS TO REPORT IMMEDIATELY:  Following upper endoscopy (EGD)  Vomiting of blood  or coffee ground material  New chest pain or pain under the shoulder blades  Painful or persistently difficult swallowing  New shortness of breath  Fever of 100F or higher  Black, tarry-looking stools  For urgent or emergent issues, a gastroenterologist can be reached at any hour by calling [phone]. Do not use MyChart messaging for urgent concerns.    DIET:  We do recommend a small meal at first, but then you may proceed to your regular diet.  Drink plenty of fluids but you should avoid alcoholic beverages for 24 hours.  ACTIVITY:  You should plan to take it easy for the rest of today and you should NOT DRIVE or use heavy machinery until tomorrow (because of the sedation medicines used during the test).    FOLLOW UP: Our staff will call the number listed on your records the next business day following your procedure.  We will call around 7:15- 8:00 am to check on you and address any questions or concerns that you may have regarding the information given to you following your procedure. If we do not reach you, we will leave a message.     If any biopsies were taken you will be contacted by phone or by letter within the next 1-3 weeks.  Please call us at [phone] if you have not heard about the biopsies in 3 weeks.    SIGNATURES/CONFIDENTIALITY: You and/or your care partner have signed paperwork which will be entered into your electronic medical record.  These signatures attest to the fact that that the information above on your After Visit Summary has been reviewed and is understood.  Full responsibility of the confidentiality of this discharge information lies with you and/or your care-partner.

## 2023-02-26 NOTE — Progress Notes (Signed)
Called to room to assist during endoscopic procedure.  Patient ID and intended procedure confirmed with present staff. Received instructions for my participation in the procedure from the performing physician.  

## 2023-02-26 NOTE — Progress Notes (Signed)
Cell phone off per pt  ?

## 2023-02-26 NOTE — Progress Notes (Signed)
No changes to clinical history since GI office visit on 02/20/23.  The patient is appropriate for an endoscopic procedure in the ambulatory setting.  - Amada Jupiter, MD

## 2023-02-26 NOTE — Progress Notes (Signed)
1049 Robinul 0.1 mg IV given due large amount of secretions upon assessment.  MD made aware, vss  

## 2023-02-26 NOTE — Progress Notes (Signed)
Report given to PACU, vss 

## 2023-02-26 NOTE — Op Note (Signed)
[facility] Patient Name: Sarang Radoncic Procedure Date: 02/26/2023 10:43 AM MRN: [id number] Endoscopist: Sherilyn Cooter L. Myrtie Neither , MD, [id number] Age: 52 Referring MD:  Date of Birth: 1971-10-22 Gender: Male Account #: [id number] Procedure:                Upper GI endoscopy Indications:              Nausea with vomiting, Weight loss Medicines:                Monitored Anesthesia Care Procedure:                Pre-Anesthesia Assessment:                           - Prior to the procedure, a History and Physical                            was performed, and patient medications and                            allergies were reviewed. The patient's tolerance of                            previous anesthesia was also reviewed. The risks                            and benefits of the procedure and the sedation                            options and risks were discussed with the patient.                            All questions were answered, and informed consent                            was obtained. Prior Anticoagulants: The patient has                            taken no anticoagulant or antiplatelet agents. ASA                            Grade Assessment: II - A patient with mild systemic                            disease. After reviewing the risks and benefits,                            the patient was deemed in satisfactory condition to                            undergo the procedure.                           After obtaining informed consent, the endoscope was  passed under direct vision. Throughout the                            procedure, the patient's blood pressure, pulse, and                            oxygen saturations were monitored continuously. The                            Olympus scope [id number] was introduced through the                            mouth, and advanced to the second part of duodenum.                            The upper GI endoscopy  was accomplished without                            difficulty. The patient tolerated the procedure                            well. Scope In: Scope Out: Findings:                 The larynx was normal.                           The esophagus was normal. Scope passed easily                            through EGJ, and no luminal dilatation was seen.                           The entire examined stomach was normal except for                            some mild patchy erythema in the distal gastric                            body toward the posterior wall. Biopsies were taken                            with a cold forceps for histology (antrum and body                            in same pathology jar to rule out H. pylori).                            Pylorus normal, scope passes easily.                           The cardia and gastric fundus were normal on  retroflexion.                           The examined duodenum was normal. Biopsies for                            histology were taken with a cold forceps for                            evaluation of celiac disease. Complications:            No immediate complications. Estimated Blood Loss:     Estimated blood loss was minimal. Impression:               - Normal larynx.                           - Normal esophagus.                           - Normal stomach except for mild focal mucosal                            erythema. Antrum and body biopsied.                           - Normal examined duodenum. Biopsied. Recommendation:           - Patient has a contact number available for                            emergencies. The signs and symptoms of potential                            delayed complications were discussed with the                            patient. Return to normal activities tomorrow.                            Written discharge instructions were provided to the                             patient.                           - Resume previous diet.                           - Continue present medications.                           - Await pathology results.                           - Perform a CT scan (computed tomography) of  abdomen with contrast and pelvis with contrast As                            soon as possible (ideally within the next week). Jordyn Doane L. Myrtie Neither, MD 02/26/2023 11:08:38 AM This report has been signed electronically.

## 2023-02-26 NOTE — Telephone Encounter (Signed)
CT order in epic. Secure staff message sent to radiology scheduling to contact patient this week to set up CT appt.

## 2023-02-27 ENCOUNTER — Telehealth: Payer: Self-pay | Admitting: *Deleted

## 2023-02-27 NOTE — Telephone Encounter (Signed)
  Follow up Call-     02/26/2023   10:09 AM  Call back number  Post procedure Call Back phone  # [phone]  Permission to leave phone message Yes   [facility]

## 2023-03-04 ENCOUNTER — Other Ambulatory Visit: Payer: Self-pay | Admitting: Family Medicine

## 2023-03-04 NOTE — Telephone Encounter (Signed)
Too soon. Rx sent on 12/17/22, #90/4 to [facility].  Request denied.

## 2023-03-26 ENCOUNTER — Encounter: Payer: Self-pay | Admitting: Gastroenterology

## 2023-03-26 ENCOUNTER — Encounter: Payer: Self-pay | Admitting: Family Medicine

## 2023-03-26 DIAGNOSIS — R112 Nausea with vomiting, unspecified: Secondary | ICD-10-CM

## 2023-03-26 DIAGNOSIS — R634 Abnormal weight loss: Secondary | ICD-10-CM

## 2023-03-27 MED ORDER — ONDANSETRON HCL 4 MG PO TABS: 4.0000 mg | ORAL_TABLET | Freq: Three times a day (TID) | ORAL | 1 refills | Status: DC | PRN

## 2023-03-27 NOTE — Telephone Encounter (Signed)
Definitely needs the CT scan ASAP.  Photos do not look like a worm, there are some kind of food debris but are bile-stained, which is why they are green.  If he is not currently taking ondansetron, then he needs a prescription for 4 mg dosing to take 1 every 8 hours as needed, dispense 45 tablets, 1 refill.  I remain very concerned about him and we need to discover cause of the symptoms soon.  In addition to the CT abdomen and pelvis noted above -which I would very much like done before the end of next week (to rule out obstruction or mass), he should have a gastric emptying study scheduled for a week or so after that.  If the CT scan discovers because of the symptoms, we can always cancel the GES.  HD

## 2023-03-29 ENCOUNTER — Ambulatory Visit: Payer: 59

## 2023-08-19 ENCOUNTER — Other Ambulatory Visit: Payer: Self-pay | Admitting: Family Medicine

## 2023-11-21 ENCOUNTER — Other Ambulatory Visit: Payer: Self-pay | Admitting: Family Medicine

## 2023-11-21 DIAGNOSIS — E1169 Type 2 diabetes mellitus with other specified complication: Secondary | ICD-10-CM

## 2023-11-21 NOTE — Telephone Encounter (Signed)
LVM for patient to c/b and schedule.

## 2023-11-21 NOTE — Telephone Encounter (Signed)
Ozempic stopped by provider (see 02/15/23 OV notes). Request denied.   E-scribed metformin refill. Needs DM f/u OV for additional refills. Per 02/15/23 OV notes, pt was to have 6 wk DM f/u (around 03/27/23).

## 2023-11-29 ENCOUNTER — Ambulatory Visit: Payer: 59 | Admitting: Family Medicine

## 2023-12-23 ENCOUNTER — Other Ambulatory Visit: Payer: Self-pay | Admitting: Family Medicine

## 2023-12-23 DIAGNOSIS — E1169 Type 2 diabetes mellitus with other specified complication: Secondary | ICD-10-CM

## 2023-12-24 ENCOUNTER — Other Ambulatory Visit: Payer: Self-pay

## 2023-12-24 NOTE — Telephone Encounter (Signed)
 Too soon. Per 02/15/23 OV notes, pt is to continue metformin 500 mg once a day. Rx sent on 11/21/23, #90/0 refills to [facility].   Request denied.

## 2024-01-31 ENCOUNTER — Encounter: Payer: Self-pay | Admitting: Family Medicine

## 2024-01-31 ENCOUNTER — Ambulatory Visit: Payer: 59 | Admitting: Family Medicine

## 2024-01-31 VITALS — BP 122/76 | HR 99 | Temp 98.6°F | Ht 74.0 in | Wt 205.4 lb

## 2024-01-31 DIAGNOSIS — K760 Fatty (change of) liver, not elsewhere classified: Secondary | ICD-10-CM | POA: Diagnosis not present

## 2024-01-31 DIAGNOSIS — I6521 Occlusion and stenosis of right carotid artery: Secondary | ICD-10-CM

## 2024-01-31 DIAGNOSIS — R634 Abnormal weight loss: Secondary | ICD-10-CM

## 2024-01-31 DIAGNOSIS — G459 Transient cerebral ischemic attack, unspecified: Secondary | ICD-10-CM

## 2024-01-31 DIAGNOSIS — E663 Overweight: Secondary | ICD-10-CM | POA: Diagnosis not present

## 2024-01-31 DIAGNOSIS — Z23 Encounter for immunization: Secondary | ICD-10-CM | POA: Diagnosis not present

## 2024-01-31 DIAGNOSIS — Z1211 Encounter for screening for malignant neoplasm of colon: Secondary | ICD-10-CM

## 2024-01-31 DIAGNOSIS — Z7984 Long term (current) use of oral hypoglycemic drugs: Secondary | ICD-10-CM | POA: Diagnosis not present

## 2024-01-31 DIAGNOSIS — I10 Essential (primary) hypertension: Secondary | ICD-10-CM

## 2024-01-31 DIAGNOSIS — E785 Hyperlipidemia, unspecified: Secondary | ICD-10-CM

## 2024-01-31 DIAGNOSIS — Z125 Encounter for screening for malignant neoplasm of prostate: Secondary | ICD-10-CM | POA: Diagnosis not present

## 2024-01-31 DIAGNOSIS — Z8673 Personal history of transient ischemic attack (TIA), and cerebral infarction without residual deficits: Secondary | ICD-10-CM | POA: Diagnosis not present

## 2024-01-31 DIAGNOSIS — U099 Post covid-19 condition, unspecified: Secondary | ICD-10-CM

## 2024-01-31 DIAGNOSIS — E1169 Type 2 diabetes mellitus with other specified complication: Secondary | ICD-10-CM

## 2024-01-31 DIAGNOSIS — Z789 Other specified health status: Secondary | ICD-10-CM

## 2024-01-31 LAB — COMPREHENSIVE METABOLIC PANEL WITH GFR
ALT: 19 U/L (ref 0–53)
AST: 19 U/L (ref 0–37)
Albumin: 4.8 g/dL (ref 3.5–5.2)
Alkaline Phosphatase: 67 U/L (ref 39–117)
BUN: 12 mg/dL (ref 6–23)
CO2: 32 meq/L (ref 19–32)
Calcium: 9.8 mg/dL (ref 8.4–10.5)
Chloride: 102 meq/L (ref 96–112)
Creatinine, Ser: 1.02 mg/dL (ref 0.40–1.50)
GFR: 85.51 mL/min (ref >60.00)
Glucose, Bld: 89 mg/dL (ref 70–99)
Potassium: 5 meq/L (ref 3.5–5.1)
Sodium: 142 meq/L (ref 135–145)
Total Bilirubin: 0.4 mg/dL (ref 0.2–1.2)
Total Protein: 6.8 g/dL (ref 6.0–8.3)

## 2024-01-31 LAB — CBC WITH DIFFERENTIAL/PLATELET
Basophils Absolute: 0 10*3/uL (ref 0.0–0.1)
Basophils Relative: 0.8 % (ref 0.0–3.0)
Eosinophils Absolute: 0.2 10*3/uL (ref 0.0–0.7)
Eosinophils Relative: 5.9 % — ABNORMAL HIGH (ref 0.0–5.0)
HCT: 45.9 % (ref 39.0–52.0)
Hemoglobin: 15.9 g/dL (ref 13.0–17.0)
Lymphocytes Relative: 31.5 % (ref 12.0–46.0)
Lymphs Abs: 1.3 10*3/uL (ref 0.7–4.0)
MCHC: 34.7 g/dL (ref 30.0–36.0)
MCV: 93.3 fl (ref 78.0–100.0)
Monocytes Absolute: 0.3 10*3/uL (ref 0.1–1.0)
Monocytes Relative: 7.4 % (ref 3.0–12.0)
Neutro Abs: 2.3 10*3/uL (ref 1.4–7.7)
Neutrophils Relative %: 54.4 % (ref 43.0–77.0)
Platelets: 177 10*3/uL (ref 150.0–400.0)
RBC: 4.92 Mil/uL (ref 4.22–5.81)
RDW: 13.1 % (ref 11.5–15.5)
WBC: 4.2 10*3/uL (ref 4.0–10.5)

## 2024-01-31 LAB — LIPID PANEL
Cholesterol: 204 mg/dL — ABNORMAL HIGH (ref 0–200)
HDL: 48.9 mg/dL (ref >39.00)
LDL Cholesterol: 135 mg/dL — ABNORMAL HIGH (ref 0–99)
NonHDL: 155.08
Total CHOL/HDL Ratio: 4
Triglycerides: 99 mg/dL (ref 0.0–149.0)
VLDL: 19.8 mg/dL (ref 0.0–40.0)

## 2024-01-31 LAB — PSA: PSA: 0.37 ng/mL (ref 0.10–4.00)

## 2024-01-31 LAB — HEMOGLOBIN A1C: Hgb A1c MFr Bld: 5.4 % (ref 4.6–6.5)

## 2024-01-31 LAB — TSH: TSH: 1.39 u[IU]/mL (ref 0.35–5.50)

## 2024-01-31 LAB — LIPASE: Lipase: 67 U/L — ABNORMAL HIGH (ref 11.0–59.0)

## 2024-01-31 MED ORDER — METFORMIN HCL 500 MG PO TABS: 500.0000 mg | ORAL_TABLET | Freq: Every day | ORAL | 4 refills | Status: AC

## 2024-01-31 MED ORDER — LISINOPRIL 20 MG PO TABS: 20.0000 mg | ORAL_TABLET | Freq: Every day | ORAL | 4 refills | Status: AC

## 2024-01-31 MED ORDER — CETIRIZINE HCL 10 MG PO TABS: 10.0000 mg | ORAL_TABLET | Freq: Every day | ORAL | Status: AC | PRN

## 2024-01-31 NOTE — Patient Instructions (Addendum)
 Prevnar-20 today  Stay off ozempic.  Continue lisinopril and metformin with aspirin.  We will refer you back to Dr Myrtie Neither for colonoscopy  We will request latest diabetic eye exam.  Good to see you today Let me know if symptoms recur or ongoing weight loss.

## 2024-01-31 NOTE — Progress Notes (Signed)
 Ph: [phone] Fax: [phone]   Patient ID: Jeffery Banks, male    DOB: Jun 19, 1971, 53 y.o.   MRN: [id number]  This visit was conducted in person.  BP 122/76   Pulse 99   Temp 98.6 F (37 C) (Oral)   Ht 6\' 2"  (1.88 m)   Wt 205 lb 6 oz (93.2 kg)   SpO2 97%   BMI 26.37 kg/m    CC: CPE Subjective:   HPI: Jeffery Banks is a 53 y.o. male presenting on 01/31/2024 for Annual Exam   Last seen 02/15/2023 at that time with months of postprandial vomiting associated with 40 lb weight loss - referred urgently to GI s/p reassuring EGD - normal exam and biopsies.  RUQ [place] consistent with fatty liver changes.  Was recommended CT imaging abd/pelvis for further evaluation - never completed.  He notes symptoms are resolved after diet changes including gluten avoidance, stopping red meat and pork, and stopping sodas and energy drinks.  DM - managed with metformin 500mg  daily and ozempic 0.25mg  weekly. Cecks fasting sugars regularly: 90-105. Prior 150-160. He was on ozempic during above, lipase was mildly elevated to 69 - ?pancreatitis causing symptoms. He did stop for 2 months. He since restarted ozempic 0.25mg  weekly and continues this.  He has implemented diet changes including avoiding gluten, not eating past 7pm. He is more regular with breakfast daily. He's cut out red meat and pork, eating more chicken and [place]. Only drinking water/coffee, no more soda or energy drinks. Cheat day is Sundays. Feels this has helped symptoms significantly.   Denies recent tick bites, never hives or skin rash.  Notes some post-exertional dyspnea since COVID infection years ago. He uses albuterol inhaler PRN for this.   Has lost another 15 lbs since last year but states weight has stabilized at 205-210 since 06/2023.   Denies abdominal pain, nausea/vomiting, blood in stool, blood in urine. No dysphagia, early satiety.  Normal daily bowel movements - soft, formed. Not pale or buoyant, no red or  black stool.   Did have protein shake this morning.   Preventative: Colon cancer screening - discussed, will refer for colonoscopy Prostate cancer screening - discussed, will check yearly PSA Lung cancer screening - not eligible Flu shot -yearly  COVID shot - x2 Tdap - 2013 - consider next time Prevnar-20 today Shingrix - to consider  Advanced directives -  Seat belt use discussed Sunscreen use discussed. No changing moles on skin. Ex smoker quit 2012, <10 PY hx  Alcohol  - seldom Dentist - q6 mo Eye exam - yearly  Bowels - 1 soft formed stool/day  Lives with wife and 3 children Occupation: Transport planner for [employer] Edu: Associate's degree Activity: walking 1-3 miles daily in am Diet: water daily, some fruits/vegetables      Relevant past medical, surgical, family and social history reviewed and updated as indicated. Interim medical history since our last visit reviewed. Allergies and medications reviewed and updated. Outpatient Medications Prior to Visit  Medication Sig Dispense Refill   aspirin EC 81 MG tablet Take 1 tablet (81 mg total) by mouth daily.     atorvastatin (LIPITOR) 40 MG tablet TAKE 1 TABLET (40 MG TOTAL) BY MOUTH DAILY. NEEDS OFFICE VISIT 90 tablet 0   lisinopril (ZESTRIL) 20 MG tablet Take 1 tablet (20 mg total) by mouth daily. 90 tablet 4   metFORMIN (GLUCOPHAGE) 500 MG tablet TAKE 1 TABLET BY MOUTH EVERY DAY WITH BREAKFAST 90 tablet 0  OZEMPIC, 0.25 OR 0.5 MG/DOSE, 2 MG/3ML SOPN Inject 0.5 mg into the skin once a week.     ondansetron (ZOFRAN) 4 MG tablet Take 1 tablet (4 mg total) by mouth every 8 (eight) hours as needed for nausea or vomiting. 45 tablet 1   No facility-administered medications prior to visit.     Per HPI unless specifically indicated in ROS section below Review of Systems  Constitutional:  Negative for activity change, appetite change, chills, fatigue, fever and unexpected weight change.  HENT:  Positive for sinus pressure. Negative  for hearing loss.   Eyes:  Negative for visual disturbance.  Respiratory:  Negative for cough, chest tightness, shortness of breath and wheezing.   Cardiovascular:  Negative for chest pain, palpitations and leg swelling.  Gastrointestinal:  Negative for abdominal distention, abdominal pain, blood in stool, constipation, diarrhea, nausea and vomiting.  Genitourinary:  Negative for difficulty urinating and hematuria.  Musculoskeletal:  Negative for arthralgias, myalgias and neck pain.  Skin:  Negative for rash.  Neurological:  Negative for dizziness, seizures, syncope and headaches.  Hematological:  Negative for adenopathy. Does not bruise/bleed easily.  Psychiatric/Behavioral:  Negative for dysphoric mood. The patient is not nervous/anxious.     Objective:  BP 122/76   Pulse 99   Temp 98.6 F (37 C) (Oral)   Ht 6\' 2"  (1.88 m)   Wt 205 lb 6 oz (93.2 kg)   SpO2 97%   BMI 26.37 kg/m   Wt Readings from Last 3 Encounters:  01/31/24 205 lb 6 oz (93.2 kg)  02/26/23 221 lb (100.2 kg)  02/20/23 221 lb 6 oz (100.4 kg)      Physical Exam Vitals and nursing note reviewed.  Constitutional:      General: He is not in acute distress.    Appearance: Normal appearance. He is well-developed. He is not ill-appearing.  HENT:     Head: Normocephalic and atraumatic.     Right Ear: Hearing, tympanic membrane, ear canal and external ear normal.     Left Ear: Hearing, tympanic membrane, ear canal and external ear normal.     Mouth/Throat:     Mouth: Mucous membranes are moist.     Pharynx: Oropharynx is clear. No oropharyngeal exudate or posterior oropharyngeal erythema.  Eyes:     General: No scleral icterus.    Extraocular Movements: Extraocular movements intact.     Conjunctiva/sclera: Conjunctivae normal.     Pupils: Pupils are equal, round, and reactive to light.  Neck:     Thyroid: No thyroid mass or thyromegaly.  Cardiovascular:     Rate and Rhythm: Normal rate and regular rhythm.      Pulses: Normal pulses.          Radial pulses are 2+ on the right side and 2+ on the left side.     Heart sounds: Normal heart sounds. No murmur heard. Pulmonary:     Effort: Pulmonary effort is normal. No respiratory distress.     Breath sounds: Normal breath sounds. No wheezing, rhonchi or rales.  Abdominal:     General: Bowel sounds are normal. There is no distension.     Palpations: Abdomen is soft. There is no mass.     Tenderness: There is no abdominal tenderness. There is no guarding or rebound.     Hernia: No hernia is present.  Musculoskeletal:        General: Normal range of motion.     Cervical back: Normal range of motion and  neck supple.     Right lower leg: No edema.     Left lower leg: No edema.  Lymphadenopathy:     Cervical: No cervical adenopathy.  Skin:    General: Skin is warm and dry.     Findings: No rash.  Neurological:     General: No focal deficit present.     Mental Status: He is alert and oriented to person, place, and time.  Psychiatric:        Mood and Affect: Mood normal.        Behavior: Behavior normal.        Thought Content: Thought content normal.        Judgment: Judgment normal.       Results for orders placed or performed in visit on 01/31/24  Lipid panel   Collection Time: 01/31/24 10:20 AM  Result Value Ref Range   Cholesterol 204 (H) 0 - 200 mg/dL   Triglycerides 40.9 0.0 - 149.0 mg/dL   HDL 81.19 >14.78 mg/dL   VLDL 29.5 0.0 - 62.1 mg/dL   LDL Cholesterol 308 (H) 0 - 99 mg/dL   Total CHOL/HDL Ratio 4    NonHDL 155.08   Comprehensive metabolic panel with GFR   Collection Time: 01/31/24 10:20 AM  Result Value Ref Range   Sodium 142 135 - 145 mEq/L   Potassium 5.0 3.5 - 5.1 mEq/L   Chloride 102 96 - 112 mEq/L   CO2 32 19 - 32 mEq/L   Glucose, Bld 89 70 - 99 mg/dL   BUN 12 6 - 23 mg/dL   Creatinine, Ser 6.57 0.40 - 1.50 mg/dL   Total Bilirubin 0.4 0.2 - 1.2 mg/dL   Alkaline Phosphatase 67 39 - 117 U/L   AST 19 0 - 37 U/L    ALT 19 0 - 53 U/L   Total Protein 6.8 6.0 - 8.3 g/dL   Albumin 4.8 3.5 - 5.2 g/dL   GFR 84.69 >62.95 mL/min   Calcium 9.8 8.4 - 10.5 mg/dL  TSH   Collection Time: 01/31/24 10:20 AM  Result Value Ref Range   TSH 1.39 0.35 - 5.50 uIU/mL  Hemoglobin A1c   Collection Time: 01/31/24 10:20 AM  Result Value Ref Range   Hgb A1c MFr Bld 5.4 4.6 - 6.5 %  PSA   Collection Time: 01/31/24 10:20 AM  Result Value Ref Range   PSA 0.37 0.10 - 4.00 ng/mL  CBC with Differential/Platelet   Collection Time: 01/31/24 10:20 AM  Result Value Ref Range   WBC 4.2 4.0 - 10.5 K/uL   RBC 4.92 4.22 - 5.81 Mil/uL   Hemoglobin 15.9 13.0 - 17.0 g/dL   HCT 28.4 13.2 - 44.0 %   MCV 93.3 78.0 - 100.0 fl   MCHC 34.7 30.0 - 36.0 g/dL   RDW 10.2 72.5 - 36.6 %   Platelets 177.0 150.0 - 400.0 K/uL   Neutrophils Relative % 54.4 43.0 - 77.0 %   Lymphocytes Relative 31.5 12.0 - 46.0 %   Monocytes Relative 7.4 3.0 - 12.0 %   Eosinophils Relative 5.9 (H) 0.0 - 5.0 %   Basophils Relative 0.8 0.0 - 3.0 %   Neutro Abs 2.3 1.4 - 7.7 K/uL   Lymphs Abs 1.3 0.7 - 4.0 K/uL   Monocytes Absolute 0.3 0.1 - 1.0 K/uL   Eosinophils Absolute 0.2 0.0 - 0.7 K/uL   Basophils Absolute 0.0 0.0 - 0.1 K/uL  Lipase   Collection Time: 01/31/24 10:20 AM  Result  Value Ref Range   Lipase 67.0 (H) 11.0 - 59.0 U/L  IGA   Collection Time: 01/31/24 10:20 AM  Result Value Ref Range   Immunoglobulin A 76 47 - 310 mg/dL    Assessment & Plan:   Problem List Items Addressed This Visit     Health maintenance alteration - Primary (Chronic)   Preventative protocols reviewed and updated unless pt declined. Discussed healthy diet and lifestyle.       HTN (hypertension)   Chronic, stable period on lisinopril 20mg  daily - continue.       Relevant Medications   lisinopril (ZESTRIL) 20 MG tablet   atorvastatin (LIPITOR) 20 MG tablet   Other Relevant Orders   TSH (Completed)   Overweight with body mass index (BMI) 25.0-29.9   50+ lb weight  loss in the past year - in setting of ozempic use, prolonged GI illness last year that seems to have resolved, as well as healthy diet and lifestyle changes.  Don't recommend further weight loss - to let me know if this develops.       Dyslipidemia associated with type 2 diabetes mellitus (HCC)   Chronic, was on atorvastatin 40mg  daily but ran out months ago.  Will update FLP for new baseline off statin. The ASCVD Risk score (Arnett DK, et al., 2019) failed to calculate for the following reasons:   Risk score cannot be calculated because patient has a medical history suggesting prior/existing ASCVD       Relevant Medications   lisinopril (ZESTRIL) 20 MG tablet   metFORMIN (GLUCOPHAGE) 500 MG tablet   atorvastatin (LIPITOR) 20 MG tablet   Other Relevant Orders   Lipid panel (Completed)   Comprehensive metabolic panel with GFR (Completed)   TIA (transient ischemic attack)   H/o this 2019 - will likely recommend restarting statin.       Relevant Medications   lisinopril (ZESTRIL) 20 MG tablet   atorvastatin (LIPITOR) 20 MG tablet   Type 2 diabetes mellitus with other specified complication (HCC)   Update A1c on metformin 500mg  daily with ozempic 0.25mg  weekly.  See below - given concern for pancreatitis, will stop GLP1RA.       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       Fatty liver   Noted on imaging 12/2022 - with significant subsequent weight loss. Update LFTs.       Unintentional weight loss   Weight has stabilized to 205-210 lbs. Pt remains asxs. To let us know if develops further weight loss or recurrent symptoms.       Relevant Orders   TSH (Completed)   CBC with Differential/Platelet (Completed)    Lipase (Completed)   IGA (Completed)   Tissue Transglutaminase, IGA   Carotid stenosis, asymptomatic, right   Relevant Medications   lisinopril (ZESTRIL) 20 MG tablet   atorvastatin (LIPITOR) 20 MG tablet   Other Visit Diagnoses       Special screening for malignant neoplasm of prostate       Relevant Orders   PSA (Completed)     Special screening for malignant neoplasms, colon       Relevant Orders   Ambulatory referral to Gastroenterology     Need for vaccination against  Streptococcus pneumoniae       Relevant Orders   Pneumococcal conjugate vaccine 20-valent (Completed)        Meds ordered this encounter  Medications   lisinopril (ZESTRIL) 20 MG tablet    Sig: Take 1 tablet (20 mg total) by mouth daily.    Dispense:  90 tablet    Refill:  4   metFORMIN (GLUCOPHAGE) 500 MG tablet    Sig: Take 1 tablet (500 mg total) by mouth daily with breakfast.    Dispense:  90 tablet    Refill:  4   cetirizine (ZYRTEC) 10 MG tablet    Sig: Take 1 tablet (10 mg total) by mouth daily as needed for allergies.   atorvastatin (LIPITOR) 20 MG tablet    Sig: Take 1 tablet (20 mg total) by mouth daily.    Dispense:  90 tablet    Refill:  4    Orders Placed This Encounter  Procedures   Pneumococcal conjugate vaccine 20-valent   Lipid panel   Comprehensive metabolic panel with GFR   TSH   Hemoglobin A1c   PSA   CBC with Differential/Platelet   Lipase   IGA   Tissue Transglutaminase, IGA   Ambulatory referral to Gastroenterology    Referral Priority:   Routine    Referral Type:   Consultation    Referral Reason:   Specialty Services Required    Number of Visits Requested:   1    Patient Instructions  Prevnar-20 today  Stay off ozempic.  Continue lisinopril and metformin with aspirin.  We will refer you back to Dr Dominic Friendly for colonoscopy  We will request latest diabetic eye exam.  Good to see you today Let me know if symptoms recur or ongoing weight loss.   Follow up  plan: Return in about 6 months (around 08/01/2024) for follow up visit.  Claire Crick, MD

## 2024-01-31 NOTE — Assessment & Plan Note (Signed)
 Preventative protocols reviewed and updated unless pt declined. Discussed healthy diet and lifestyle.

## 2024-02-01 ENCOUNTER — Encounter: Payer: Self-pay | Admitting: Family Medicine

## 2024-02-01 DIAGNOSIS — I6521 Occlusion and stenosis of right carotid artery: Secondary | ICD-10-CM | POA: Insufficient documentation

## 2024-02-01 LAB — IGA: Immunoglobulin A: 76 mg/dL (ref 47–310)

## 2024-02-01 LAB — TISSUE TRANSGLUTAMINASE, IGA: (tTG) Ab, IgA: <1 U/mL

## 2024-02-01 MED ORDER — ATORVASTATIN CALCIUM 20 MG PO TABS: 20.0000 mg | ORAL_TABLET | Freq: Every day | ORAL | 4 refills | Status: AC

## 2024-02-01 NOTE — Assessment & Plan Note (Signed)
 Chronic, stable period on lisinopril 20mg  daily - continue.

## 2024-02-01 NOTE — Assessment & Plan Note (Signed)
 Noted on imaging 12/2022 - with significant subsequent weight loss. Update LFTs.

## 2024-02-01 NOTE — Assessment & Plan Note (Signed)
 50+ lb weight loss in the past year - in setting of ozempic use, prolonged GI illness last year that seems to have resolved, as well as healthy diet and lifestyle changes.  Don't recommend further weight loss - to let me know if this develops.

## 2024-02-01 NOTE — Assessment & Plan Note (Signed)
 Chronic residual exertional dyspnea managed with rare PRN albuterol inhaler.

## 2024-02-01 NOTE — Assessment & Plan Note (Signed)
 H/o this 2019 - will likely recommend restarting statin.

## 2024-02-01 NOTE — Assessment & Plan Note (Signed)
 Weight has stabilized to 205-210 lbs. Pt remains asxs. To let us  know if develops further weight loss or recurrent symptoms.

## 2024-02-01 NOTE — Assessment & Plan Note (Addendum)
 Update A1c on metformin 500mg  daily with ozempic 0.25mg  weekly.  See below - given concern for pancreatitis, will stop GLP1RA.

## 2024-02-01 NOTE — Assessment & Plan Note (Addendum)
 Chronic, was on atorvastatin 40mg  daily but ran out months ago.  Will update FLP for new baseline off statin. The ASCVD Risk score (Arnett DK, et al., 2019) failed to calculate for the following reasons:   Risk score cannot be calculated because patient has a medical history suggesting prior/existing ASCVD

## 2024-03-20 ENCOUNTER — Encounter: Payer: Self-pay | Admitting: Family Medicine

## 2024-08-03 ENCOUNTER — Ambulatory Visit: Admitting: Family Medicine

## 2024-08-26 ENCOUNTER — Ambulatory Visit: Admitting: Family Medicine

## 2024-09-09 ENCOUNTER — Ambulatory Visit: Admitting: Family Medicine

## 2024-09-23 ENCOUNTER — Ambulatory Visit: Admitting: Family Medicine

## 2024-09-30 ENCOUNTER — Ambulatory Visit: Admitting: Family Medicine

## 2024-11-04 ENCOUNTER — Ambulatory Visit: Admitting: Family Medicine
